# Patient Record
Sex: Female | Born: 2000 | Race: White | Hispanic: No | Marital: Single | Smoking: Never smoker
Health system: Southern US, Community
[De-identification: ages and names within clinical notes are randomized; demographics above are authoritative.]

## PROBLEM LIST (undated history)

## (undated) DIAGNOSIS — E063 Autoimmune thyroiditis: Secondary | ICD-10-CM

## (undated) DIAGNOSIS — E049 Nontoxic goiter, unspecified: Secondary | ICD-10-CM

## (undated) DIAGNOSIS — K900 Celiac disease: Secondary | ICD-10-CM

## (undated) DIAGNOSIS — F909 Attention-deficit hyperactivity disorder, unspecified type: Secondary | ICD-10-CM

## (undated) DIAGNOSIS — E10649 Type 1 diabetes mellitus with hypoglycemia without coma: Secondary | ICD-10-CM

## (undated) DIAGNOSIS — H5089 Other specified strabismus: Secondary | ICD-10-CM

## (undated) DIAGNOSIS — E11649 Type 2 diabetes mellitus with hypoglycemia without coma: Secondary | ICD-10-CM

## (undated) DIAGNOSIS — E109 Type 1 diabetes mellitus without complications: Secondary | ICD-10-CM

## (undated) DIAGNOSIS — E7801 Familial hypercholesterolemia: Secondary | ICD-10-CM

## (undated) HISTORY — DX: Nontoxic goiter, unspecified: E04.9

## (undated) HISTORY — DX: Autoimmune thyroiditis: E06.3

## (undated) HISTORY — DX: Familial hypercholesterolemia: E78.01

## (undated) HISTORY — DX: Type 1 diabetes mellitus without complications: E10.9

## (undated) HISTORY — DX: Attention-deficit hyperactivity disorder, unspecified type: F90.9

## (undated) HISTORY — PX: NO PAST SURGERIES: SHX2092

## (undated) HISTORY — DX: Type 1 diabetes mellitus with hypoglycemia without coma: E10.649

## (undated) HISTORY — DX: Celiac disease: K90.0

## (undated) HISTORY — DX: Other specified strabismus: H50.89

## (undated) HISTORY — DX: Type 2 diabetes mellitus with hypoglycemia without coma: E11.649

---
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
## 2019-01-16 ENCOUNTER — Other Ambulatory Visit: Payer: Self-pay

## 2019-01-16 DIAGNOSIS — Z20822 Contact with and (suspected) exposure to covid-19: Secondary | ICD-10-CM

## 2019-01-16 DIAGNOSIS — Z20828 Contact with and (suspected) exposure to other viral communicable diseases: Secondary | ICD-10-CM | POA: Diagnosis not present

## 2019-01-18 LAB — NOVEL CORONAVIRUS, NAA

## 2019-01-20 ENCOUNTER — Encounter: Payer: Self-pay | Admitting: Family

## 2019-01-20 ENCOUNTER — Other Ambulatory Visit: Payer: Self-pay

## 2019-01-20 DIAGNOSIS — Z20822 Contact with and (suspected) exposure to covid-19: Secondary | ICD-10-CM

## 2019-01-20 DIAGNOSIS — Z20828 Contact with and (suspected) exposure to other viral communicable diseases: Secondary | ICD-10-CM | POA: Diagnosis not present

## 2019-01-21 ENCOUNTER — Other Ambulatory Visit: Payer: Self-pay

## 2019-01-21 DIAGNOSIS — F411 Generalized anxiety disorder: Secondary | ICD-10-CM

## 2019-01-21 MED ORDER — SERTRALINE HCL 100 MG PO TABS: 100.0000 mg | ORAL_TABLET | Freq: Every day | ORAL | 0 refills | Status: DC

## 2019-01-21 MED FILL — SERTRALINE HCL 100 MG TAB: 100 | 90 days supply | Qty: 90 | Fill #0

## 2019-01-21 NOTE — Telephone Encounter (Signed)
Patient emailed in for refill for Zoloft. Last visit 09/11/2018 next visit 02/19/2019. Please escribe to [employer] [facility]

## 2019-01-21 NOTE — Telephone Encounter (Signed)
RX for above e-scribed and sent to pharmacy on record  [facility] - [place], [place] - [address] [place] [address] Phone: [phone] Fax: [phone]    

## 2019-01-22 ENCOUNTER — Encounter (INDEPENDENT_AMBULATORY_CARE_PROVIDER_SITE_OTHER): Payer: Self-pay | Admitting: Pediatrics

## 2019-01-22 ENCOUNTER — Ambulatory Visit (INDEPENDENT_AMBULATORY_CARE_PROVIDER_SITE_OTHER): Payer: BC Managed Care – PPO | Admitting: Pediatrics

## 2019-01-22 ENCOUNTER — Telehealth (INDEPENDENT_AMBULATORY_CARE_PROVIDER_SITE_OTHER): Payer: Self-pay | Admitting: *Deleted

## 2019-01-22 ENCOUNTER — Other Ambulatory Visit: Payer: Self-pay

## 2019-01-22 DIAGNOSIS — K900 Celiac disease: Secondary | ICD-10-CM

## 2019-01-22 DIAGNOSIS — E063 Autoimmune thyroiditis: Secondary | ICD-10-CM | POA: Diagnosis not present

## 2019-01-22 DIAGNOSIS — E1069 Type 1 diabetes mellitus with other specified complication: Secondary | ICD-10-CM

## 2019-01-22 DIAGNOSIS — E109 Type 1 diabetes mellitus without complications: Secondary | ICD-10-CM | POA: Diagnosis not present

## 2019-01-22 DIAGNOSIS — E785 Hyperlipidemia, unspecified: Secondary | ICD-10-CM

## 2019-01-22 DIAGNOSIS — N921 Excessive and frequent menstruation with irregular cycle: Secondary | ICD-10-CM

## 2019-01-22 DIAGNOSIS — Z9641 Presence of insulin pump (external) (internal): Secondary | ICD-10-CM

## 2019-01-22 LAB — NOVEL CORONAVIRUS, NAA: SARS-CoV-2, NAA: DETECTED — AB

## 2019-01-22 NOTE — Patient Instructions (Signed)

## 2019-01-22 NOTE — Telephone Encounter (Signed)
TC to [place] to advise that I called [facility] to increase pump supplies to 15/mo. Rep specified that order is ready for patient and or parent to call them to send order in. Patient ok with information given.

## 2019-01-22 NOTE — Progress Notes (Signed)
This is a Pediatric Specialist E-Visit follow up consult provided via WebEx Kiara Greer Greer consented to an E-Visit consult today.  Location of patient: Kiara Greer is at hotel in [place] Location of provider: Theodosia Palingshley Bashioum Brittnay Pigman,MD is at [facility] ([facility]) office Patient was referred by Santa GeneraBates, Melisa, MD   The following participants were involved in this E-Visit: Casimiro NeedleAshley Bashioum Kamilia Carollo, MD, Kiara Greer (patient)  Chief Complain/ Reason for E-Visit today: Follow-up T1DM Total time on call: 18 minutes Follow up: 3 months   Pediatric Endocrinology Diabetes Consultation Follow-up Visit  Kiara Greer Greer 09/28/2000 [id number]  Chief Complaint: Follow-up type 1 diabetes, acquired hypothyroidism, celiac disease, abnormal lipids, menorrhagia  Santa GeneraBates, Melisa, MD   HPI: Kiara Greer Greer is a 18 y.o. female presenting for follow-up of the above concerns.  she attended this visit alone.   THIS IS A TELEHEALTH VIDEO VISIT.  1. Kiara Greer was diagnosed with type 1 diabetes mellitus at age 19 months in 2002; she was not in DKA at diagnosis.  [facility]. She was started on an insulin pump one week after diagnosis.  She most recently upgraded to a medtronic 530G pump in Summer 2016.  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 Celiac disease was diagnosed in 2008. She was referred to our clinic on 01/14/09 at age [age] years.  Her mother and younger sister, [name] also had T1DM and celiac disease. Mother was also hypothyroid then. Sister [name] was initially euthyroid, but developed hypothyroidism in 2012.She started using a dexcom CGM in 04/2016. She transitioned to a Tslim pump in 11/2018.  2. Since last visit on 10/10/2018, she has been well.   ED visits/Hospitalizations: No   Concerns:  -Currently in quarantine hotel through college for COVID exposure.  Tested positive this morning.   Symptoms are improving (initially had headache, runny nose, sore throat, mild cough).   -Likes tslim pump, doing better with bolusing for carbs  Insulin regimen: Unable to download her pump as Tslim website is down Tslim settings (from pump start 11/06/2018) Time Basal Rate Correction factor Carb ratio Target BG   12a 1.0 35 mg/dL 9 409120 mg/dL  6a 1.1 30 mg/dL 9 811110 mg/dL  8a 9.141.35 30 mg/dL 9  782110 mg/dL  95A12p 2.131.35 30 mg/dL 9 086110 mg/dL  57Q10p 4.691.35 35 mg/dL 9 629120 mg/dL   Total 52.829.8 Units       BG/Pump download:  Unable to perform due to video visit/website being down  CGM download: Unable to perform  Hypoglycemia: No recent low blood sugars.  No glucagon needed recently.  Wearing Med-alert ID currently: Not discussed Injection sites: CGM on arms, pump site on buttocks Annual labs due: 10/2019 Ophthalmology due: 09/2019 (Performed 09/2018 with Dr. Randon GoldsmithLyles, no retinopathy)  Acquired hypothyroidism: She takes synthroid 137mcg daily (increased 10/2017).   Energy level: good, has been more tired since school started back (thinks it may just be related to more activity since COVID pandemic started) Sleep: as usual (not a great sleeper) Constipation/Diarrhea: no Periods regular: yes, on OCPs below Tremor: no Palpitations: no  Celiac disease: Continues on a gluten free diet "for the most part" Nausea/vomiting/constipation/diarrhea: no Weight changes: no  Hyperlipidemia: She continues on crestor 10mg  daily (increased in 10/2018) and a plant based sterol called cholest-off.  Muscle aches: None  Menorrhagia with irregular cycles:  Started on OCPs in 12/2016 for menorrhagia with irregular cycles with improvement in menses though these were stopped in 03/2017 due  to concerns they were contributing to depression.  Menses resumed very heavy so she was restarted on OCPs in 07/2017 without change in depression.   Continues on Junel Fe 1/20 Withdrawal bleeding during week of inactive pills:  yes Spotting: no Severe cramping: not as bad as in the past.  Every 2 months period is heavier Advised against smoking while on OCPs    ROS: All systems reviewed with pertinent positives listed below; otherwise negative. Constitutional: Sleeping as above HEENT: eye exam as above Respiratory: No increased work of breathing currently GI: No constipation or diarrhea GU: periods as above Musculoskeletal: No joint deformity Neuro: Normal affect Endocrine: As above  Past Medical History:   Past Medical History:  Diagnosis Date  . ADHD (attention deficit hyperactivity disorder)   . Celiac disease   . Diabetes mellitus type I ([place])   . Duanes syndrome   . Goiter   . Hashimoto's thyroiditis   . Hyperlipidemia type II   . Hypoglycemia associated with diabetes ([place])   . Hypoglycemia unawareness in type 1 diabetes mellitus ([place])   . Hypothyroidism, acquired, autoimmune     Medications:  Outpatient Encounter Medications as of 01/22/2019  Medication Sig Note  . acetone, urine, test strip Check ketones per protocol   . albuterol (VENTOLIN HFA) 108 (90 Base) MCG/ACT inhaler 1-2 inhalations every 4-6 hours as needed for wheezing. Dispense spacer as needed. 10/10/2018: Keeps for PRN use  . Amphetamine Sulfate (EVEKEO) 10 MG TABS Take 10-20 mg by mouth 2 (two) times daily.   . Calcium-Vitamin D-Vitamin K (VIACTIV) 751-025-85 MG-UNT-MCG CHEW Chew by mouth.     . cetirizine (ZYRTEC) 10 MG tablet Take 10 mg by mouth as needed for allergies.   . Continuous Blood Gluc Sensor (DEXCOM G6 SENSOR) MISC Inject 1 application into the skin as needed (Apply new sensor every 10 days).   . Continuous Blood Gluc Transmit (DEXCOM G6 TRANSMITTER) MISC Inject 1 Device into the skin as needed (Use new transmitter every 3 months).   . fluticasone (FLONASE) 50 MCG/ACT nasal spray 2 sprays in each nostril daily x1 week then 1-2 sprays in each nostril daily. (use smallest dose possible for symptom control after week 1).    . Glucagon (BAQSIMI TWO PACK) 3 MG/DOSE POWD Place 1 application into the nose as needed. Use as directed if unconscious, unable to take food po, or having a seizure due to hypoglycemia   . insulin aspart (NOVOLOG) 100 UNIT/ML injection 300 units in  Insulin Pump every 48 hours   . Multiple Vitamin (MULTIVITAMIN) tablet Take 1 tablet by mouth daily.     . norethindrone-ethinyl estradiol (JUNEL FE 1/20) 1-20 MG-MCG tablet Take 1 tablet by mouth daily.   Marland Kitchen PROAIR HFA 108 (90 Base) MCG/ACT inhaler 1-2 INHALATIONS EVERY 4-6 HOURS AS NEEDED FOR WHEEZING. DISPENSE SPACER AS NEEDED. 10/10/2018: Keeps for PRN use  . rosuvastatin (CRESTOR) 10 MG tablet Take 1 tablet (10 mg total) by mouth daily.   . sertraline (ZOLOFT) 100 MG tablet Take 1 tablet (100 mg total) by mouth daily.   Marland Kitchen SYNTHROID 137 MCG tablet Take 1 tablet (137 mcg total) by mouth daily. Brand name medically necessary    No facility-administered encounter medications on file as of 01/22/2019.   Taking an OTC tablet called cholest-off for hyperlipidemia (contains plant sterols)  Allergies: No Known Allergies  Surgical History: Past Surgical History:  Procedure Laterality Date  . NO PAST SURGERIES      Family History:  Family History  Problem Relation Age of Onset  . Diabetes Mother   . Thyroid disease Mother   . Celiac disease Mother   . Diabetes Sister   . Thyroid disease Sister   . Celiac disease Sister   . Diabetes Maternal Uncle   . Thyroid disease Maternal Grandmother   . Celiac disease Maternal Grandmother   . Cancer Neg Hx     Social History: Attending [employer], sophomore year  Physical Exam:  There were no vitals filed for this visit. There were no vitals taken for this visit. Body mass index: body mass index is unknown because there is no height or weight on file. Blood pressure percentiles are not available for patients who are 18 years or older.  Ht Readings from Last 3 Encounters:  01/31/18 5' 4.57" (1.64 m)  (55 %, Z= 0.12)*  10/25/17 5' 5.35" (1.66 m) (67 %, Z= 0.43)*  07/05/17 5' 4.76" (1.645 m) (58 %, Z= 0.21)*   * Growth percentiles are based on CDC (Girls, 2-20 Years) data.   Wt Readings from Last 3 Encounters:  01/31/18 136 lb (61.7 kg) (68 %, Z= 0.46)*  10/25/17 128 lb 9.6 oz (58.3 kg) (57 %, Z= 0.18)*  07/05/17 130 lb 12.8 oz (59.3 kg) (62 %, Z= 0.31)*   * Growth percentiles are based on CDC (Girls, 2-20 Years) data.   General: Well developed, well nourished female in no acute distress.  Appears stated age.  Well appearing Head: Normocephalic, atraumatic.   Eyes:  Sclera white.  No eye drainage.   Ears/Nose/Mouth/Throat: Nares patent, no nasal drainage.   Neck: No obvious thyromegaly Cardiovascular: Well perfused, no cyanosis Respiratory: No increased work of breathing.  No cough. Extremities: Moving extremities well.   Skin: No rash or lesions. Neurologic: alert and oriented, normal speech  Labs: Hemoglobin A1c trend: 9.3%-->10.7%-->10.2% -->9.3%-->8.6%-->8.3% 12/2016-->11.2% 04/2017-->9.5% 07/2017-->8.9% 10/2017-->11% 01/2018, 11.4% 10/2018  Lab Results  Component Value Date   POCGLU 117 (A) 01/31/2018   Lab Results  Component Value Date   HGBA1C 11.4 (H) 10/14/2018   Assessment/Plan: Ellizabeth Dacruz is a 18 y.o. female with T1DM in suboptimal (though hopefully improving) control on the Tandem control IQ closed loop pump/CGM combo.  She also has acquired hypothyroidism and is clinically euthyroid on current levothyroxine dosing.  She continues to follow a gluten-free diet most of the time and has no GI symptoms.  She also continues on rosuvastatin and cholest-off for hyperlipidemia.  She is doing well on low dose OCP.  When a patient is on insulin, intensive monitoring of blood glucose levels and continuous insulin titration is vital to avoid insulin toxicity leading to severe hypoglycemia. Severe hypoglycemia can lead to seizure or death. Hyperglycemia can also result from  inadequate insulin dosing and can lead to ketosis requiring ICU admission and intravenous insulin.   1. Uncontrolled diabetes mellitus type 1 without complications (HCC) 2. Insulin pump in place -Continue current pump settings.  Advised to call if having lows/highs or patterns that need pump adjustment.  -Needs more supplies per month.  Will have Lorena call [facility] to relay this info and allow more supplies with each shipment.   3. Acquired autoimmune hypothyroidism -Continue current levothyroxine  4. Celiac disease -Continue gluten free diet  5. Hyperlipidemia due to type 1 diabetes mellitus (HCC) -Continue rosuvastatin  6. Menorrhagia with irregular cycle -Continue current OCP  Follow-up:   Return in about 3 months (around 04/24/2019).    Casimiro Needle, MD

## 2019-01-30 DIAGNOSIS — E1065 Type 1 diabetes mellitus with hyperglycemia: Secondary | ICD-10-CM | POA: Diagnosis not present

## 2019-02-19 ENCOUNTER — Encounter: Payer: Self-pay | Admitting: Family

## 2019-02-19 ENCOUNTER — Ambulatory Visit (INDEPENDENT_AMBULATORY_CARE_PROVIDER_SITE_OTHER): Payer: BC Managed Care – PPO | Admitting: Family

## 2019-02-19 ENCOUNTER — Other Ambulatory Visit: Payer: Self-pay

## 2019-02-19 DIAGNOSIS — K900 Celiac disease: Secondary | ICD-10-CM

## 2019-02-19 DIAGNOSIS — F819 Developmental disorder of scholastic skills, unspecified: Secondary | ICD-10-CM

## 2019-02-19 DIAGNOSIS — E063 Autoimmune thyroiditis: Secondary | ICD-10-CM

## 2019-02-19 DIAGNOSIS — H5089 Other specified strabismus: Secondary | ICD-10-CM

## 2019-02-19 DIAGNOSIS — F411 Generalized anxiety disorder: Secondary | ICD-10-CM

## 2019-02-19 DIAGNOSIS — F900 Attention-deficit hyperactivity disorder, predominantly inattentive type: Secondary | ICD-10-CM

## 2019-02-19 DIAGNOSIS — E7801 Familial hypercholesterolemia: Secondary | ICD-10-CM

## 2019-02-19 DIAGNOSIS — E049 Nontoxic goiter, unspecified: Secondary | ICD-10-CM

## 2019-02-19 DIAGNOSIS — Z719 Counseling, unspecified: Secondary | ICD-10-CM

## 2019-02-19 DIAGNOSIS — Z79899 Other long term (current) drug therapy: Secondary | ICD-10-CM

## 2019-02-19 DIAGNOSIS — E7800 Pure hypercholesterolemia, unspecified: Secondary | ICD-10-CM

## 2019-02-19 DIAGNOSIS — E109 Type 1 diabetes mellitus without complications: Secondary | ICD-10-CM

## 2019-02-19 MED ORDER — AMPHETAMINE SULFATE 10 MG PO TABS: 10.0000 mg | ORAL_TABLET | Freq: Two times a day (BID) | ORAL | 0 refills | Status: DC

## 2019-02-19 MED FILL — AMPHETAMINE SULFATE 10 MG T: 10 | 30 days supply | Qty: 120 | Fill #0

## 2019-02-19 NOTE — Progress Notes (Signed)
[facility] [address] [place] [address] Dept: [phone] Dept Fax: [phone]  Medication Check visit via Virtual Video due to COVID-19  Patient ID:  Kiara Greer  female DOB: 2000/08/23   18 y.o.   MRN: [id number]   DATE:02/19/19  PCP: Kandace Blitz, MD  Virtual Visit via Video Note  I connected with  Kiara Greer on 02/19/19 at 10:00 AM EST by a video enabled telemedicine application and verified that I am speaking with the correct person using two identifiers. Patient/Parent Location: at school   I discussed the limitations, risks, security and privacy concerns of performing an evaluation and management service by telephone and the availability of in person appointments. I also discussed with the parents that there may be a patient responsible charge related to this service. The parents expressed understanding and agreed to proceed.  Provider: Carolann Littler, NP  Location: work location  HISTORY/CURRENT STATUS: Kiara Greer is here for medication management of the psychoactive medications for ADHD and review of educational and behavioral concerns.   Karsten currently taking Evekeo and Zoloft, which is working well. Takes medication in the morning with breakfast. Medication tends to wear off around evening for the Evekeo. [place]ia is able to focus through school/homework.   Rudie is eating well (eating breakfast, lunch and dinner). Eating with no issues.   Sleeping well (getting plenty of sleep each night), sleeping through the night.   EDUCATION: School: [employer]  Year/Grade: college  Performance/ Grades: above average Services: Other: help as needed  Kiara Greer is currently in distance learning due to social distancing due to COVID-19 and will continue for at least: for the fall semester.  Activities/ Exercise: intermittently, walking on campus and on occasion by  herself for exercise.   Screen time: (phone, tablet, TV, computer): computer for school, phone, TV, and movies.   MEDICAL HISTORY: Individual Medical History/ Review of Systems: Changes? :Yes, recently had COVID-19 with positive results. Had endocrinology telehealth visit recently.   Family Medical/ Social History: Changes? None recently.  Patient Lives with: 3 roommates  Current Medications:  Current Outpatient Medications  Medication Instructions  . acetone, urine, test strip Check ketones per protocol  . albuterol (VENTOLIN HFA) 108 (90 Base) MCG/ACT inhaler 1-2 inhalations every 4-6 hours as needed for wheezing. Dispense spacer as needed.  . Amphetamine Sulfate (EVEKEO) 10-20 mg, Oral, 2 times daily  . Calcium-Vitamin D-Vitamin K (VIACTIV) 606-301-60 MG-UNT-MCG CHEW Chew by mouth.    . cetirizine (ZYRTEC) 10 mg, Oral, As needed  . Continuous Blood Gluc Sensor (DEXCOM G6 SENSOR) MISC 1 application, Subcutaneous, As needed  . Continuous Blood Gluc Transmit (DEXCOM G6 TRANSMITTER) MISC 1 Device, Subcutaneous, As needed  . fluticasone (FLONASE) 50 MCG/ACT nasal spray 2 sprays in each nostril daily x1 week then 1-2 sprays in each nostril daily. (use smallest dose possible for symptom control after week 1).  . Glucagon (BAQSIMI TWO PACK) 3 MG/DOSE POWD 1 application, Nasal, As needed, Use as directed if unconscious, unable to take food po, or having a seizure due to hypoglycemia  . insulin aspart (NOVOLOG) 100 UNIT/ML injection 300 units in  Insulin Pump every 48 hours  . Multiple Vitamin (MULTIVITAMIN) tablet 1 tablet, Daily  . norethindrone-ethinyl estradiol (JUNEL FE 1/20) 1-20 MG-MCG tablet 1 tablet, Oral, Daily  . PROAIR HFA 108 (90 Base) MCG/ACT inhaler 1-2 INHALATIONS EVERY 4-6 HOURS AS NEEDED FOR WHEEZING. DISPENSE SPACER AS NEEDED.  Marland Kitchen rosuvastatin (  CRESTOR) 10 mg, Oral, Daily  . sertraline (ZOLOFT) 100 mg, Oral, Daily  . Synthroid 137 mcg, Oral, Daily, Brand name medically  necessary   Medication Side Effects: None  MENTAL HEALTH: Mental Health Issues:   Anxiety-Zoloft assisting with symptom control.   DIAGNOSES:    ICD-10-CM   1. ADHD (attention deficit hyperactivity disorder), inattentive type  F90.0 Amphetamine Sulfate (EVEKEO) 10 MG TABS  2. Learning difficulty  F81.9   3. Generalized anxiety disorder  F41.1   4. Hashimoto's thyroiditis  E06.3   5. Celiac disease  K90.0   6. Hypercholesterolemia  E78.00   7. Hyperlipidemia type II  E78.01   8. Type 1 diabetes mellitus without complication (HCC)  E10.9   9. 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 Acquired autoimmune hypothyroidism  E06.3   11. Goiter  E04.9   12. Medication management  Z79.899   13. Patient counseled  Z71.9     RECOMMENDATIONS:  Discussed recent history with patient with updates for health, learning, and medication management.   Discussed school academic progress and recommended continued accommodations for the school year.  Discussed continued need for structure, routine, reward (external), motivation (internal), positive reinforcement, consequences, and organization with school and online learning.   Encouraged recommended limitations on TV, tablets, phones, video games and computers for non-educational activities.   Discussed need for bedtime routine, use of good sleep hygiene, no video games, TV or phones for an hour before bedtime.   Encouraged physical activity and outdoor play, maintaining social distancing.   Counseled medication pharmacokinetics, options, dosage, administration, desired effects, and possible side effects.   Zoloft 100 mg daily, no Rx today.  Evekeo 10 mg 1-2 tablets twice daily, # 120 with no RF"s. RX for above e-scribed and sent to pharmacy on record  [facility] - [place], [place] - [address] [place] [place] [address] Phone: [phone] Fax: [phone]  I discussed the assessment  and treatment plan with the patient. The patient was provided an opportunity to ask questions and all were answered. The patient agreed with the plan and demonstrated an understanding of the instructions.   I provided 25 minutes of non-face-to-face time during this encounter.   Completed record review for 10 minutes prior to the virtual video visit.   NEXT APPOINTMENT:  Return in about 3 months (around 05/22/2019) for follow up visit.  The patient was advised to call back or seek an in-person evaluation if the symptoms worsen or if the condition fails to improve as anticipated.  Medical Decision-making: More than 50% of the appointment was spent counseling and discussing diagnosis and management of symptoms with the patient and family.  Carron Curie, NP

## 2019-02-28 ENCOUNTER — Other Ambulatory Visit (INDEPENDENT_AMBULATORY_CARE_PROVIDER_SITE_OTHER): Payer: Self-pay | Admitting: Pediatrics

## 2019-05-06 ENCOUNTER — Encounter: Payer: Self-pay | Admitting: Family

## 2019-05-06 ENCOUNTER — Other Ambulatory Visit: Payer: Self-pay

## 2019-05-06 ENCOUNTER — Ambulatory Visit (INDEPENDENT_AMBULATORY_CARE_PROVIDER_SITE_OTHER): Payer: BC Managed Care – PPO | Admitting: Family

## 2019-05-06 DIAGNOSIS — F900 Attention-deficit hyperactivity disorder, predominantly inattentive type: Secondary | ICD-10-CM | POA: Diagnosis not present

## 2019-05-06 DIAGNOSIS — K900 Celiac disease: Secondary | ICD-10-CM

## 2019-05-06 DIAGNOSIS — F411 Generalized anxiety disorder: Secondary | ICD-10-CM

## 2019-05-06 DIAGNOSIS — E7800 Pure hypercholesterolemia, unspecified: Secondary | ICD-10-CM | POA: Diagnosis not present

## 2019-05-06 DIAGNOSIS — E785 Hyperlipidemia, unspecified: Secondary | ICD-10-CM

## 2019-05-06 DIAGNOSIS — R278 Other lack of coordination: Secondary | ICD-10-CM

## 2019-05-06 DIAGNOSIS — E1069 Type 1 diabetes mellitus with other specified complication: Secondary | ICD-10-CM

## 2019-05-06 DIAGNOSIS — Z79899 Other long term (current) drug therapy: Secondary | ICD-10-CM

## 2019-05-06 DIAGNOSIS — E049 Nontoxic goiter, unspecified: Secondary | ICD-10-CM

## 2019-05-06 DIAGNOSIS — E7801 Familial hypercholesterolemia: Secondary | ICD-10-CM

## 2019-05-06 DIAGNOSIS — F819 Developmental disorder of scholastic skills, unspecified: Secondary | ICD-10-CM

## 2019-05-06 DIAGNOSIS — E063 Autoimmune thyroiditis: Secondary | ICD-10-CM

## 2019-05-06 DIAGNOSIS — Z719 Counseling, unspecified: Secondary | ICD-10-CM

## 2019-05-06 MED ORDER — AMPHETAMINE SULFATE 10 MG PO TABS: 10.0000 mg | ORAL_TABLET | Freq: Two times a day (BID) | ORAL | 0 refills | Status: DC

## 2019-05-06 MED ORDER — SERTRALINE HCL 100 MG PO TABS: 100.0000 mg | ORAL_TABLET | Freq: Every day | ORAL | 0 refills | Status: DC

## 2019-05-06 MED FILL — SERTRALINE HCL 100 MG TAB: 100 | 90 days supply | Qty: 90 | Fill #0

## 2019-05-06 MED FILL — AMPHETAMINE SULFATE 10 MG T: 10 | 30 days supply | Qty: 120 | Fill #0

## 2019-05-06 NOTE — Progress Notes (Signed)
[facility] [address] [place]. [address] [place] [address] Dept: [phone] Dept Fax: [phone]  Medication Check visit via Virtual Video due to COVID-19  Patient ID:  Kiara Greer  female DOB: [birth year]   19 y.o.   MRN: [id number]   DATE:05/06/19  PCP: Santa Genera, MD  Virtual Visit via Video Note  I connected with  [name] on 05/06/19 at  2:00 PM EST by a video enabled telemedicine application and verified that I am speaking with the correct person using two identifiers. Patient/Parent Location: at school   I discussed the limitations, risks, security and privacy concerns of performing an evaluation and management service by telephone and the availability of in person appointments. I also discussed with the parents that there may be a patient responsible charge related to this service. The parents expressed understanding and agreed to proceed.  Provider: Carron Curie, NP  Location: private location  HISTORY/CURRENT STATUS: Kiara Greer is here for medication management of the psychoactive medications for ADHD and review of educational and behavioral concerns.   [name] currently taking medication regimen, which is working well. Takes medication as directed. Medication tends to wear off around evening time with Evekeo. Zakyria is able to focus through school/homework.   [name] is eating well (eating breakfast, lunch and dinner). Eating well with no issues.   Sleeping well (getting better now with change back from home to school), sleeping through the night.   EDUCATION: School: [employer]  Year/Grade: college  Performance/ Grades: above average Services: IEP/504 Plan and Other: Disability  [name] is currently in distance learning due to social distancing due to COVID-19 and will continue through: for the remainder of the semester.    Activities/ Exercise: intermittently Screen  time: (phone, tablet, TV, computer): computer for learning, TV, phone, and movies.   MEDICAL HISTORY: Individual Medical History/ Review of Systems: Changes? :None  Family Medical/ Social History: Changes? None recently Patient Lives with: roommate at school  Current Medications:  Current Outpatient Medications  Medication Instructions  . acetone, urine, test strip Check ketones per protocol  . albuterol (VENTOLIN HFA) 108 (90 Base) MCG/ACT inhaler 1-2 inhalations every 4-6 hours as needed for wheezing. Dispense spacer as needed.  . Amphetamine Sulfate (EVEKEO) 10-20 mg, Oral, 2 times daily  . Calcium-Vitamin D-Vitamin K (VIACTIV) 500-100-40 MG-UNT-MCG CHEW Chew by mouth.    . cetirizine (ZYRTEC) 10 mg, Oral, As needed  . Continuous Blood Gluc Sensor (DEXCOM G6 SENSOR) MISC 1 application, Subcutaneous, As needed  . Continuous Blood Gluc Transmit (DEXCOM G6 TRANSMITTER) MISC CHANGE EVERY 3 MONTHS  . fluticasone (FLONASE) 50 MCG/ACT nasal spray 2 sprays in each nostril daily x1 week then 1-2 sprays in each nostril daily. (use smallest dose possible for symptom control after week 1).  . Glucagon (BAQSIMI TWO PACK) 3 MG/DOSE POWD 1 application, Nasal, As needed, Use as directed if unconscious, unable to take food po, or having a seizure due to hypoglycemia  . insulin aspart (NOVOLOG) 100 UNIT/ML injection 300 units in  Insulin Pump every 48 hours  . Multiple Vitamin (MULTIVITAMIN) tablet 1 tablet, Daily  . norethindrone-ethinyl estradiol (JUNEL FE 1/20) 1-20 MG-MCG tablet 1 tablet, Oral, Daily  . PROAIR HFA 108 (90 Base) MCG/ACT inhaler 1-2 INHALATIONS EVERY 4-6 HOURS AS NEEDED FOR WHEEZING. DISPENSE SPACER AS NEEDED.  Marland Kitchen rosuvastatin (CRESTOR) 10 mg, Oral, Daily  . sertraline (ZOLOFT) 100 mg, Oral, Daily  . Synthroid 137 mcg, Oral, Daily,  Brand name medically necessary   Medication Side Effects: None  MENTAL HEALTH: Mental Health Issues:   Anxiety-some increase  DIAGNOSES:    ICD-10-CM    1. ADHD (attention deficit hyperactivity disorder), inattentive type  F90.0 Amphetamine Sulfate (EVEKEO) 10 MG TABS  2. Generalized anxiety disorder  F41.1 sertraline (ZOLOFT) 100 MG tablet  3. Hashimoto's thyroiditis  E06.3   4. 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 Hypercholesterolemia  E78.00   6. Hyperlipidemia type II  E78.01   7. Celiac disease  K90.0   8. Goiter  E04.9   9. Problems with learning  F81.9   10. Dysgraphia  R27.8   11. Medication management  Z79.899   12. Patient counseled  Z71.9   13. 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   Discussed school academic progress and recommended continued accommodations as needed for academics success.   Discussed growth and development and current weight with patient. Recommended healthy food choices, watching portion sizes, avoiding second helpings, avoiding sugary drinks like soda and tea, drinking more water, getting more exercise.   Discussed continued need for structure, routine, reward (external), motivation (internal), positive reinforcement, consequences, and organization with school and living in the dorm room with roommate.   Encouraged recommended limitations on TV, tablets, phones, video games and computers for non-educational activities.   Discussed need for bedtime routine, use of good sleep hygiene, no video games, TV or phones for an hour before bedtime.   Encouraged physical activity and outdoor play, maintaining social distancing.   Counseled medication pharmacokinetics, options, dosage, administration, desired effects, and possible side effects.   Evekeo 10 mg 1-2 tablet BID # 120 with no RF's Zoloft 100 mg daily, # 90 with no RF's. RX for above e-scribed and sent to pharmacy on record  [place], [place] -  [address] [place] [address] Phone: [phone] Fax: [phone]   I discussed the assessment and treatment plan with the patient. The patient was provided an opportunity to ask questions and all were answered. The patient agreed with the plan and demonstrated an understanding of the instructions.   I provided 25 minutes of non-face-to-face time during this encounter.  Completed record review for 10 minutes prior to the virtual video visit.   NEXT APPOINTMENT:  Return in about 3 months (around 08/03/2019) for follow up visit.  The patient was advised to call back or seek an in-person evaluation if the symptoms worsen or if the condition fails to improve as anticipated.  Medical Decision-making: More than 50% of the appointment was spent counseling and discussing diagnosis and management of symptoms with the patient and family.  Carolann Littler, NP

## 2019-06-25 DIAGNOSIS — E1065 Type 1 diabetes mellitus with hyperglycemia: Secondary | ICD-10-CM | POA: Diagnosis not present

## 2019-07-10 ENCOUNTER — Encounter (INDEPENDENT_AMBULATORY_CARE_PROVIDER_SITE_OTHER): Payer: Self-pay | Admitting: Pediatrics

## 2019-07-10 ENCOUNTER — Telehealth (INDEPENDENT_AMBULATORY_CARE_PROVIDER_SITE_OTHER): Payer: BC Managed Care – PPO | Admitting: Pediatrics

## 2019-07-10 VITALS — Ht 65.0 in

## 2019-07-10 DIAGNOSIS — N921 Excessive and frequent menstruation with irregular cycle: Secondary | ICD-10-CM

## 2019-07-10 DIAGNOSIS — Z9641 Presence of insulin pump (external) (internal): Secondary | ICD-10-CM

## 2019-07-10 DIAGNOSIS — E063 Autoimmune thyroiditis: Secondary | ICD-10-CM | POA: Diagnosis not present

## 2019-07-10 DIAGNOSIS — E109 Type 1 diabetes mellitus without complications: Secondary | ICD-10-CM | POA: Diagnosis not present

## 2019-07-10 DIAGNOSIS — E1069 Type 1 diabetes mellitus with other specified complication: Secondary | ICD-10-CM

## 2019-07-10 DIAGNOSIS — K900 Celiac disease: Secondary | ICD-10-CM | POA: Diagnosis not present

## 2019-07-10 DIAGNOSIS — E785 Hyperlipidemia, unspecified: Secondary | ICD-10-CM

## 2019-07-10 NOTE — Patient Instructions (Addendum)

## 2019-07-10 NOTE — Progress Notes (Signed)
This is a Pediatric Specialist E-Visit follow up consult provided via [place] consented to an E-Visit consult today.  Location of patient: Kiara Greer is at home Location of provider: Glenna Greer is at [facility] (Pediatric Endocrinology) office Patient was referred by Kiara Blitz, MD   The following participants were involved in this E-Visit: Kiara Hedger, MD, patient  Chief Complain/ Reason for E-Visit today: see below Total time on call: 15 minutes Follow up: 3 months  Pediatric Endocrinology Diabetes Consultation Follow-up Visit  Kiara Greer [birth year] [id number]  Chief Complaint: Follow-up type 1 diabetes, acquired hypothyroidism, celiac disease, abnormal lipids, menorrhagia  Kiara Blitz, MD   HPI: Kiara Greer is a 19 y.o. female presenting for follow-up of the above concerns.  she attended this visit alone.  THIS IS A TELEHEALTH VIDEO VISIT.  57. Kiara Greer was diagnosed with type 1 diabetes mellitus at age 72 months in 2002; she was not in DKA at diagnosis.  She was initially followed at [facility] pediatric endocrinology. She was started on an insulin pump one week after diagnosis.  She most recently upgraded to a medtronic 530G pump in Summer 2016.  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 Celiac disease was diagnosed in 2008. She was referred to our clinic on 01/14/09 at age 28.4 years.  Her mother and younger sister, Kiara Greer also had T1DM and celiac disease. Mother was also hypothyroid then. Sister Kiara Greer was initially euthyroid, but developed hypothyroidism in 2012.She started using a dexcom CGM in 04/2016. She transitioned to a Tslim pump in 11/2018.  2. Since last visit on 01/22/2019 (telehealth), she has been well.   ED visits/Hospitalizations: No   Concerns:  -Overall BGs are doing better.  Dexcom sometimes frustrating because it falls off.  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  No patterns for highs or lows.  Hypoglycemia: Able to feel low blood sugars.  No glucagon needed recently.  Wearing Med-alert ID currently: Not discussed today Injection sites: CGM on arms, pump site on back, sometimes abd Annual labs due: 10/2019 Ophthalmology due: 09/2019 (Performed 09/2018 with Dr. Prudencio Greer, no retinopathy)  Acquired hypothyroidism: She takes synthroid 141mg daily (increased 10/2017).  Better with taking her medicine daily Energy level: has felt tired recently, thinks it is due to increased work with end of the semester coming Sleep: same as usual Constipation/Diarrhea: none Periods regular: yes, on OCPs below  Celiac disease: Continues on a gluten free diet most of the time (doing better than in the past) Nausea/vomiting/constipation/diarrhea: None Weight changes: None that she is aware of  Hyperlipidemia: She continues on crestor 165mdaily (increased in 10/2018) and a plant based sterol called cholest-off.  Muscle aches: None  Menorrhagia with irregular cycles:  Started on OCPs in 12/2016 for menorrhagia with irregular  cycles with improvement in menses though these were stopped in 03/2017 due to concerns they were contributing to depression.  Menses resumed very heavy so she was restarted on OCPs in 07/2017 without change in depression.   Continues on Junel Fe 1/20 Withdrawal bleeding during week of  inactive pills: yes Spotting: no Severe cramping: not too bad   ROS: All systems reviewed with pertinent positives listed below; otherwise negative. Constitutional: Weight as above.  Sleeping as above Respiratory: No increased work of breathing currently GI: As above GU: periods as above Neuro: Normal affect Endocrine: As above  Past Medical History:   Past Medical History:  Diagnosis Date  . ADHD (attention deficit hyperactivity disorder)   . Celiac disease   . Diabetes mellitus type I ([place])   . Duanes syndrome   . Goiter   . Hashimoto's thyroiditis   . Hyperlipidemia type II   . Hypoglycemia associated with diabetes ([place])   . Hypoglycemia unawareness in type 1 diabetes mellitus ([place])   . Hypothyroidism, acquired, autoimmune     Medications:  Outpatient Encounter Medications as of 07/10/2019  Medication Sig Note  . acetone, urine, test strip Check ketones per protocol   . Amphetamine Sulfate (EVEKEO) 10 MG TABS Take 10-20 mg by mouth 2 (two) times daily.   . Calcium-Vitamin D-Vitamin K (VIACTIV) 627-035-00 MG-UNT-MCG CHEW Chew by mouth.     . cetirizine (ZYRTEC) 10 MG tablet Take 10 mg by mouth as needed for allergies.   . Continuous Blood Gluc Sensor (DEXCOM G6 SENSOR) MISC Inject 1 application into the skin as needed (Apply new sensor every 10 days).   . Continuous Blood Gluc Transmit (DEXCOM G6 TRANSMITTER) MISC CHANGE EVERY 3 MONTHS   . Glucagon (BAQSIMI TWO PACK) 3 MG/DOSE POWD Place 1 application into the nose as needed. Use as directed if unconscious, unable to take food po, or having a seizure due to hypoglycemia   . insulin aspart (NOVOLOG) 100 UNIT/ML injection 300 units in  Insulin Pump every 48 hours   . Multiple Vitamin (MULTIVITAMIN) tablet Take 1 tablet by mouth daily.     . norethindrone-ethinyl estradiol (JUNEL FE 1/20) 1-20 MG-MCG tablet Take 1 tablet by mouth daily.   . rosuvastatin (CRESTOR) 10 MG tablet Take 1 tablet (10 mg total) by mouth daily.   .  sertraline (ZOLOFT) 100 MG tablet Take 1 tablet (100 mg total) by mouth daily.   Marland Kitchen SYNTHROID 137 MCG tablet Take 1 tablet (137 mcg total) by mouth daily. Brand name medically necessary   . albuterol (VENTOLIN HFA) 108 (90 Base) MCG/ACT inhaler 1-2 inhalations every 4-6 hours as needed for wheezing. Dispense spacer as needed. 10/10/2018: Keeps for PRN use  . fluticasone (FLONASE) 50 MCG/ACT nasal spray 2 sprays in each nostril daily x1 week then 1-2 sprays in each nostril daily. (use smallest dose possible for symptom control after week 1).   Marland Kitchen PROAIR HFA 108 (90 Base) MCG/ACT inhaler 1-2 INHALATIONS EVERY 4-6 HOURS AS NEEDED FOR WHEEZING. DISPENSE SPACER AS NEEDED. 10/10/2018: Keeps for PRN use   No facility-administered encounter medications on file as of 07/10/2019.  Taking an OTC tablet called cholest-off for hyperlipidemia (contains plant sterols)  Allergies: No Known Allergies  Surgical History: Past Surgical History:  Procedure Laterality Date  . NO PAST SURGERIES      Family History:  Family History  Problem Relation Age of Onset  . Diabetes Mother   . Thyroid disease Mother   . Celiac disease Mother   .  Diabetes Sister   . Thyroid disease Sister   . Celiac disease Sister   . Diabetes Maternal Uncle   . Thyroid disease Maternal Grandmother   . Celiac disease Maternal Grandmother   . Cancer Neg Hx     Social History: Attending [employer], sophomore year.  Internship at [employer] this summer.  Getting apartment over the summer in [place] for school next year  Physical Exam:  Vitals:   07/10/19 0927  Height: 5' 5"  (1.651 m)   Ht 5' 5"  (1.651 m) Comment: Patient stated  LMP 07/03/2019 (Within Days)   BMI 22.63 kg/m  Body mass index: body mass index is 22.63 kg/m. Growth percentile SmartLinks can only be used for patients less than 57 years old.  Ht Readings from Last 3 Encounters:  07/10/19 5' 5"  (1.651 m)  01/31/18 5' 4.57" (1.64 m) (55 %, Z= 0.12)*  10/25/17 5' 5.35" (1.66 m) (67  %, Z= 0.43)*   * Growth percentiles are based on CDC (Girls, 2-20 Years) data.   Wt Readings from Last 3 Encounters:  01/31/18 136 lb (61.7 kg) (68 %, Z= 0.46)*  10/25/17 128 lb 9.6 oz (58.3 kg) (57 %, Z= 0.18)*  07/05/17 130 lb 12.8 oz (59.3 kg) (62 %, Z= 0.31)*   * Growth percentiles are based on CDC (Girls, 2-20 Years) data.   Limited due to video visit.   General: Well developed, well nourished female in no acute distress.  Appears stated age Head: Normocephalic, atraumatic.   Eyes:  Pupils equal and round. Sclera white.  No eye drainage.   Ears/Nose/Mouth/Throat: Nares patent, no nasal drainage.  Normal dentition. Cardiovascular: well perfused, no cyanosis Respiratory: No increased work of breathing.  No cough Neurologic: alert and oriented, normal speech  Labs: Hemoglobin A1c trend: 9.3%-->10.7%-->10.2% -->9.3%-->8.6%-->8.3% 12/2016-->11.2% 04/2017-->9.5% 07/2017-->8.9% 10/2017-->11% 01/2018, 11.4% 10/2018  Lab Results  Component Value Date   POCGLU 117 (A) 01/31/2018   Lab Results  Component Value Date   HGBA1C 11.4 (H) 10/14/2018   Assessment/Plan: Deshia Vanderhoof is a 19 y.o. female with T1DM in suboptimal control on the Tandem control IQ closed loop pump/CGM combo.  She also has acquired hypothyroidism and is clinically euthyroid on current levothyroxine dosing.  She continues to follow a gluten-free diet most of the time and has no GI symptoms.  She also continues on rosuvastatin for hyperlipidemia.  She is doing well on low dose OCP.  When a patient is on insulin, intensive monitoring of blood glucose levels and continuous insulin titration is vital to avoid insulin toxicity leading to severe hypoglycemia. Severe hypoglycemia can lead to seizure or death. Hyperglycemia can also result from inadequate insulin dosing and can lead to ketosis requiring ICU admission and intravenous insulin.   1. Uncontrolled diabetes mellitus type 1 without complications ([place]) 2. Insulin pump  in place -Continue current pump settings.  Advised to call if having lows/highs or patterns that need pump adjustment.  -Will review pump download in detail at next in-person visit -Annual DM labs at next visit  3. Acquired autoimmune hypothyroidism -Continue current levothyroxine.  Will draw labs at next visit.   4. Celiac disease -Continue gluten free diet  5. 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 Menorrhagia with irregular cycle -Continue current OCP  Follow-up:   Return in about 3 months (around 10/09/2019).   >40 minutes spent today reviewing the medical chart, counseling the patient/family, and documenting today's encounter.   Caryl Pina [employer]  Charna Archer, MD

## 2019-07-20 ENCOUNTER — Other Ambulatory Visit: Payer: Self-pay | Admitting: Family

## 2019-07-20 DIAGNOSIS — F900 Attention-deficit hyperactivity disorder, predominantly inattentive type: Secondary | ICD-10-CM

## 2019-07-20 DIAGNOSIS — F411 Generalized anxiety disorder: Secondary | ICD-10-CM

## 2019-07-21 MED ORDER — AMPHETAMINE SULFATE 10 MG PO TABS: 10.0000 mg | ORAL_TABLET | Freq: Two times a day (BID) | ORAL | 0 refills | Status: DC

## 2019-07-21 MED ORDER — SERTRALINE HCL 100 MG PO TABS: 100.0000 mg | ORAL_TABLET | Freq: Every day | ORAL | 0 refills | Status: DC

## 2019-07-21 MED FILL — AMPHETAMINE SULFATE 10 MG T: 10 | 30 days supply | Qty: 120 | Fill #0

## 2019-07-21 MED FILL — SERTRALINE HCL 100 MG TAB: 100 | 90 days supply | Qty: 90 | Fill #0

## 2019-07-21 NOTE — Telephone Encounter (Signed)
RX for above e-scribed and sent to pharmacy on record   [place], [place] [address] [place] [address] Phone: [phone] Fax: [phone]

## 2019-08-01 ENCOUNTER — Other Ambulatory Visit (INDEPENDENT_AMBULATORY_CARE_PROVIDER_SITE_OTHER): Payer: Self-pay | Admitting: Pediatrics

## 2019-08-01 DIAGNOSIS — E1069 Type 1 diabetes mellitus with other specified complication: Secondary | ICD-10-CM

## 2019-08-01 MED ORDER — DEXCOM G6 SENSOR MISC: 1.0000 "application " | 3 refills | Status: DC | PRN

## 2019-08-01 NOTE — Telephone Encounter (Signed)
Both have been sent in.

## 2019-08-01 NOTE — Telephone Encounter (Signed)
  Who's calling (name and relationship to patient) : Amy ( Mom)  Best contact [phone]  Provider they see: Dr. Charna Archer  Reason for call: mom called to check on RX refill for both daughters she had appointment and was told the Dexcom had been ordered but pharmacy still saying they haven't received either refill     PRESCRIPTION REFILL ONLY  Name of prescription:  Pharmacy:[facility], [employer] education officer service)  [place]

## 2019-08-05 ENCOUNTER — Encounter (INDEPENDENT_AMBULATORY_CARE_PROVIDER_SITE_OTHER): Payer: Self-pay

## 2019-08-06 ENCOUNTER — Other Ambulatory Visit (INDEPENDENT_AMBULATORY_CARE_PROVIDER_SITE_OTHER): Payer: Self-pay

## 2019-08-06 DIAGNOSIS — E1069 Type 1 diabetes mellitus with other specified complication: Secondary | ICD-10-CM

## 2019-08-06 DIAGNOSIS — E109 Type 1 diabetes mellitus without complications: Secondary | ICD-10-CM

## 2019-08-06 DIAGNOSIS — E063 Autoimmune thyroiditis: Secondary | ICD-10-CM

## 2019-08-06 MED ORDER — DEXCOM G6 SENSOR MISC: 1.0000 "application " | 3 refills | Status: DC | PRN

## 2019-08-06 MED ORDER — DEXCOM G6 TRANSMITTER MISC: 3 refills | Status: DC

## 2019-08-06 MED ORDER — SYNTHROID 137 MCG PO TABS: 137.0000 ug | ORAL_TABLET | Freq: Every day | ORAL | 3 refills | Status: DC

## 2019-08-06 MED ORDER — INSULIN ASPART 100 UNIT/ML ~~LOC~~ SOLN: SUBCUTANEOUS | 3 refills | Status: DC

## 2019-08-06 MED ORDER — BAQSIMI TWO PACK 3 MG/DOSE NA POWD: 1.0000 "application " | NASAL | 1 refills | Status: DC | PRN

## 2019-09-16 ENCOUNTER — Encounter: Payer: BC Managed Care – PPO | Admitting: Family

## 2019-09-18 ENCOUNTER — Encounter: Payer: Self-pay | Admitting: Family

## 2019-09-18 ENCOUNTER — Ambulatory Visit (INDEPENDENT_AMBULATORY_CARE_PROVIDER_SITE_OTHER): Payer: BC Managed Care – PPO | Admitting: Family

## 2019-09-18 ENCOUNTER — Other Ambulatory Visit: Payer: Self-pay

## 2019-09-18 VITALS — BP 110/72 | HR 72 | Resp 16 | Ht 64.76 in | Wt 155.0 lb

## 2019-09-18 DIAGNOSIS — E063 Autoimmune thyroiditis: Secondary | ICD-10-CM

## 2019-09-18 DIAGNOSIS — Z79899 Other long term (current) drug therapy: Secondary | ICD-10-CM

## 2019-09-18 DIAGNOSIS — E7800 Pure hypercholesterolemia, unspecified: Secondary | ICD-10-CM | POA: Diagnosis not present

## 2019-09-18 DIAGNOSIS — E7801 Familial hypercholesterolemia: Secondary | ICD-10-CM

## 2019-09-18 DIAGNOSIS — Z7189 Other specified counseling: Secondary | ICD-10-CM

## 2019-09-18 DIAGNOSIS — F900 Attention-deficit hyperactivity disorder, predominantly inattentive type: Secondary | ICD-10-CM | POA: Diagnosis not present

## 2019-09-18 DIAGNOSIS — E049 Nontoxic goiter, unspecified: Secondary | ICD-10-CM

## 2019-09-18 DIAGNOSIS — Z719 Counseling, unspecified: Secondary | ICD-10-CM

## 2019-09-18 DIAGNOSIS — K900 Celiac disease: Secondary | ICD-10-CM

## 2019-09-18 NOTE — Progress Notes (Addendum)
[facility] [address] [place] [address] Dept: [phone] Dept Fax: [phone] Loc: [phone] Loc Fax: [phone]  Medication Check  Patient ID: Kiara Greer, female  DOB: 04/15/2000, 19 y.o.  MRN: [id number]  Date of Evaluation: 09/18/2019 PCP: Kandace Blitz, MD  Accompanied by: self Patient Lives with: parents  HISTORY/CURRENT STATUS: HPI Patient here by herself for the visit today. Patient interactive with provider and appropriate at the visit today. Patient did well last semester and mostly in person classes. 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 No classes this summer due to cancellation by the college. No recent health changes and updates for specialists reviewed. Patient getting some exercising in when able to and no changes to her dietary habits. Has continued with her medication regimen with no side effects reported.   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   MEDICAL HISTORY: Appetite: Good with no changes  MVI/Other: MVI and Vitamin D   Sleep: Bedtime: 12:00 am   Awakens: 7:30-8:00 am  Concerns: Initiation/Maintenance/Other: None  Individual Medical History/ Review of Systems: Changes? :Yes, had f/u with Endocrine in April with no medication changes.   Allergies: Patient has no known allergies.  Current Medications:  Current Outpatient Medications:  .  acetone, urine, test strip, Check ketones per protocol, Disp: 50 each, Rfl: 3 .  Amphetamine Sulfate (EVEKEO) 10 MG TABS, Take 10-20 mg  by mouth 2 (two) times daily., Disp: 120 tablet, Rfl: 0 .  Calcium-Vitamin D-Vitamin K (VIACTIV) 903-009-23 MG-UNT-MCG CHEW, Chew by mouth.  , Disp: , Rfl:  .  cetirizine (ZYRTEC) 10 MG tablet, Take 10 mg by mouth as needed for allergies., Disp: , Rfl:  .  Continuous Blood Gluc Sensor (DEXCOM G6 SENSOR) MISC, Inject 1 application into the skin as needed (Apply new sensor every 10 days)., Disp: 9 each, Rfl: 3 .  Continuous Blood Gluc Transmit (DEXCOM G6 TRANSMITTER) MISC, CHANGE EVERY 3 MONTHS, Disp: 1 each, Rfl: 3 .  Glucagon (BAQSIMI TWO PACK) 3 MG/DOSE POWD, Place 1 application into the nose as needed. Use as directed if unconscious, unable to take food po, or having a seizure due to hypoglycemia, Disp: 2 each, Rfl: 1 .  insulin aspart (NOVOLOG) 100 UNIT/ML injection, 300 units in  Insulin Pump every 48 hours, Disp: 150 mL, Rfl: 3 .  Multiple Vitamin (MULTIVITAMIN) tablet, Take 1 tablet by mouth daily.  , Disp: , Rfl:  .  norethindrone-ethinyl estradiol (JUNEL FE 1/20) 1-20 MG-MCG tablet, Take 1 tablet by mouth daily., Disp: 84 tablet, Rfl: 3 .  rosuvastatin (CRESTOR) 10 MG tablet, Take 1 tablet (10 mg total) by mouth daily., Disp: 90 tablet, Rfl: 3 .  sertraline (ZOLOFT) 100 MG tablet, Take 1 tablet (100 mg total) by mouth daily., Disp: 90 tablet, Rfl: 0 .  SYNTHROID 137 MCG tablet, Take 1 tablet (137 mcg total) by mouth daily. Brand name medically necessary, Disp: 90 tablet, Rfl: 3 .  albuterol (VENTOLIN HFA) 108 (90 Base) MCG/ACT inhaler, 1-2 inhalations every 4-6 hours as needed for wheezing. Dispense spacer as needed., Disp: , Rfl:  .  fluticasone (FLONASE) 50 MCG/ACT nasal spray, 2 sprays in each nostril daily x1  week then 1-2 sprays in each nostril daily. (use smallest dose possible for symptom control after week 1)., Disp: , Rfl:  .  olopatadine (PATANOL) 0.1 % ophthalmic solution, , Disp: , Rfl:  .  PROAIR HFA 108 (90 Base) MCG/ACT inhaler, 1-2 INHALATIONS EVERY 4-6 HOURS AS NEEDED FOR  WHEEZING. DISPENSE SPACER AS NEEDED. (Patient not taking: Reported on 09/18/2019), Disp: , Rfl: 0 Medication Side Effects: None  Family Medical/ Social History: Changes? None reported recently  MENTAL HEALTH: Mental Health Issues: Depression and Anxiety-Zoloft 100 mg daily, with good symptom control with medication. No suicidal thoughts or ideations. No counseling at this time.   PHYSICAL EXAM; Vitals:  General Physical Exam: Unchanged from previous exam, date: None Changed: None  Testing/Developmental Screens:  DIAGNOSES:    ICD-10-CM   1. ADHD (attention deficit hyperactivity disorder), inattentive type  F90.0   2. Hyperlipidemia type II  E78.01   3. Hypercholesterolemia  E78.00   4. 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 Celiac disease  K90.0   6. Goiter  E04.9   7. Hashimoto's thyroiditis  E06.3   8. Medication management  Z79.899   9. Patient counseled  Z71.9   10. Goals of care, counseling/discussion  Z71.89     RECOMMENDATIONS:  Counseling at this visit included the review of old records and/or current chart with the patient with updates with school, work, internship, and babysitting with heath and medications.  Discussed recent history and today's examination with patient with no changes today on exam.   Counseled regarding health and recent updates with specialists since last f/u visit.   Recommended a high protein, low sugar diet, watch portion sizes, avoid second helpings, avoid sugary snacks and drinks, drink more water, eat more fruits and vegetables, increase daily exercise.  Discussed school academic and behavioral progress and advocated for appropriate accommodations needed for support as needed on campus.   Discussed importance of maintaining structure, routine, organization, reward, motivation and consequences with consistency with school, home and work settings.   Counseled medication pharmacokinetics, options, dosage, administration, desired  effects, and possible side effects.   Zoloft 100 mg daily, no Rx today Evekeo 10 mg 1-2 tablets daily, no Rx today  Advised importance of:  Good sleep hygiene (8- 10 hours per night, no TV or video games for 1 hour before bedtime) Limited screen time (none on school nights, no more than 2 hours/day on weekends, use of screen time for motivation) Regular exercise(outside and active play) Healthy eating (drink water or milk, no sodas/sweet tea, limit portions and no seconds).  NEXT APPOINTMENT: Return in about 3 months (around 12/19/2019) for f/u visit.  Medical Decision-making: More than 50% of the appointment was spent counseling and discussing diagnosis and management of symptoms with the patient and family.  Carolann Littler, NP Counseling Time: 25 mins Total Contact Time: 30 mins

## 2019-09-22 DIAGNOSIS — E119 Type 2 diabetes mellitus without complications: Secondary | ICD-10-CM | POA: Diagnosis not present

## 2019-09-22 DIAGNOSIS — H5000 Unspecified esotropia: Secondary | ICD-10-CM | POA: Diagnosis not present

## 2019-09-22 LAB — HM DIABETES EYE EXAM

## 2019-09-24 DIAGNOSIS — E1065 Type 1 diabetes mellitus with hyperglycemia: Secondary | ICD-10-CM | POA: Diagnosis not present

## 2019-12-02 DIAGNOSIS — E1065 Type 1 diabetes mellitus with hyperglycemia: Secondary | ICD-10-CM | POA: Diagnosis not present

## 2019-12-10 ENCOUNTER — Encounter: Payer: Self-pay | Admitting: Family

## 2019-12-15 ENCOUNTER — Telehealth (INDEPENDENT_AMBULATORY_CARE_PROVIDER_SITE_OTHER): Payer: BC Managed Care – PPO | Admitting: Family

## 2019-12-15 ENCOUNTER — Encounter: Payer: Self-pay | Admitting: Family

## 2019-12-15 DIAGNOSIS — E063 Autoimmune thyroiditis: Secondary | ICD-10-CM

## 2019-12-15 DIAGNOSIS — F411 Generalized anxiety disorder: Secondary | ICD-10-CM

## 2019-12-15 DIAGNOSIS — E7800 Pure hypercholesterolemia, unspecified: Secondary | ICD-10-CM

## 2019-12-15 DIAGNOSIS — E049 Nontoxic goiter, unspecified: Secondary | ICD-10-CM

## 2019-12-15 DIAGNOSIS — Z79899 Other long term (current) drug therapy: Secondary | ICD-10-CM

## 2019-12-15 DIAGNOSIS — F900 Attention-deficit hyperactivity disorder, predominantly inattentive type: Secondary | ICD-10-CM

## 2019-12-15 DIAGNOSIS — E7801 Familial hypercholesterolemia: Secondary | ICD-10-CM | POA: Diagnosis not present

## 2019-12-15 DIAGNOSIS — F819 Developmental disorder of scholastic skills, unspecified: Secondary | ICD-10-CM

## 2019-12-15 DIAGNOSIS — Z7189 Other specified counseling: Secondary | ICD-10-CM

## 2019-12-15 DIAGNOSIS — Z719 Counseling, unspecified: Secondary | ICD-10-CM

## 2019-12-15 DIAGNOSIS — H5089 Other specified strabismus: Secondary | ICD-10-CM

## 2019-12-15 DIAGNOSIS — K900 Celiac disease: Secondary | ICD-10-CM

## 2019-12-15 MED ORDER — SERTRALINE HCL 100 MG PO TABS: 100.0000 mg | ORAL_TABLET | Freq: Every day | ORAL | 0 refills | Status: DC

## 2019-12-15 MED FILL — SERTRALINE HCL 100 MG TABS: 100 | 90 days supply | Qty: 90 | Fill #0

## 2019-12-15 NOTE — Progress Notes (Signed)
[facility] [place] [place] [address] Dept: [phone] Dept Fax: [phone]  Medication Check visit via Virtual Video due to COVID-19  Patient ID:  Kiara Greer  female DOB: [birth year]   19 y.o.   MRN: [id number]   DATE:12/15/19  PCP: Kandace Blitz, MD  Virtual Visit via Video Note  I connected with  Kiara Greer  on 12/15/19 at 11:00 AM EDT by a video enabled telemedicine application and verified that I am speaking with the correct person using two identifiers. Patient/Parent Location: at school   I discussed the limitations, risks, security and privacy concerns of performing an evaluation and management service by telephone and the availability of in person appointments. I also discussed with the parents that there may be a patient responsible charge related to this service. The parents expressed understanding and agreed to proceed.  Provider: Carolann Littler, NP  Location: private location  HISTORY/CURRENT STATUS: Kiara Greer is here for medication management of the psychoactive medications for ADHD and review of educational and behavioral concerns.   Kiara Greer currently taking Zoloft and Evekeo, which is working well. Takes medication as directed. Medication tends to wear off around evening time for the [facility]. Kiara Greer is able to focus through school/homework.   Kiara Greer is eating well (eating breakfast, lunch and dinner). Eating with no changes reported.   Sleeping well (trying to get back into a routine), sleeping through the night.   EDUCATION: School: [employer] Year/Grade: Junior in [employer]  Performance/ Grades: average Services: Other: Disability Services 2 on campus jobs [employer] now  Activities/ Exercise: intermittently-walking on campus, gym more recently  Screen time: (phone, tablet, TV, computer): computer for learning, phone, TV and movies.   MEDICAL  HISTORY: Individual Medical History/ Review of Systems: Changes? :No recent updates with endocrine or GYN.   Family Medical/ Social History: Changes? No Patient Lives with: parents and sister when at home, and off campus with 3 roommates.   Current Medications:  Current Outpatient Medications  Medication Instructions  . acetone, urine, test strip Check ketones per protocol  . albuterol (VENTOLIN HFA) 108 (90 Base) MCG/ACT inhaler 1-2 inhalations every 4-6 hours as needed for wheezing. Dispense spacer as needed.  . Amphetamine Sulfate (EVEKEO) 10-20 mg, Oral, 2 times daily  . Calcium-Vitamin D-Vitamin K (VIACTIV) 654-650-35 MG-UNT-MCG CHEW Chew by mouth.    . cetirizine (ZYRTEC) 10 mg, Oral, As needed  . Continuous Blood Gluc Sensor (DEXCOM G6 SENSOR) MISC 1 application, Subcutaneous, As needed  . Continuous Blood Gluc Transmit (DEXCOM G6 TRANSMITTER) MISC CHANGE EVERY 3 MONTHS  . fluticasone (FLONASE) 50 MCG/ACT nasal spray 2 sprays in each nostril daily x1 week then 1-2 sprays in each nostril daily. (use smallest dose possible for symptom control after week 1).  . Glucagon (BAQSIMI TWO PACK) 3 MG/DOSE POWD 1 application, Nasal, As needed, Use as directed if unconscious, unable to take food po, or having a seizure due to hypoglycemia  . insulin aspart (NOVOLOG) 100 UNIT/ML injection 300 units in  Insulin Pump every 48 hours  . Multiple Vitamin (MULTIVITAMIN) tablet 1 tablet, Daily  . norethindrone-ethinyl estradiol (JUNEL FE 1/20) 1-20 MG-MCG tablet 1 tablet, Oral, Daily  . olopatadine (PATANOL) 0.1 % ophthalmic solution No dose, route, or frequency recorded.  Marland Kitchen PROAIR HFA 108 (90 Base) MCG/ACT inhaler 1-2 INHALATIONS EVERY 4-6 HOURS AS NEEDED FOR WHEEZING. DISPENSE SPACER AS NEEDED.  Marland Kitchen rosuvastatin (CRESTOR) 10 mg, Oral, Daily  .  sertraline (ZOLOFT) 100 mg, Oral, Daily  . Synthroid 137 mcg, Oral, Daily, Brand name medically necessary   Medication Side Effects: None  MENTAL  HEALTH: Mental Health Issues:   Anxiety-Zoloft controlling anxiety symptoms well with no side effect or suicidal thoughts or ideations.   DIAGNOSES:    ICD-10-CM   1. ADHD (attention deficit hyperactivity disorder), inattentive type  F90.0   2. Generalized anxiety disorder  F41.1 sertraline (ZOLOFT) 100 MG tablet  3. 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 Hyperlipidemia type II  E78.01   5. Hypercholesterolemia  E78.00   6. Celiac disease  K90.0   7. Hashimoto's thyroiditis  E06.3   8. Goiter  E04.9   9. 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 Problems with learning  F81.9   11. Medication management  Z79.899   12. Patient counseled  Z71.9   13. Goals of care, counseling/discussion  Z71.89     RECOMMENDATIONS:  Discussed recent history with patient with updates for school classes, academics, learning, health and medications.   Discussed school academic progress and recommended continued accommodations   Referred to [employer] for resources about using distance learning with children with ADHD  Children and young adults with ADHD often suffer from disorganization, difficulty with time management, completing projects and other executive function difficulties.  Recommended Reading: "Smart but Scattered" and "Smart but Scattered Teens" by Peg Renato Battles and Ethelene Browns.    Discussed growth and development and current weight. Recommended healthy food choices, watching portion sizes, avoiding second helpings, avoiding sugary drinks like soda and tea, drinking more water, getting more exercise.   Recommended making each meal calorie dense by increasing calories in foods like using whole milk and 4% yogurt, adding butter and sour cream. Encourage foods like lunch meat, peanut butter and cheese. Offer afternoon and bedtime snacks when appetite is not suppressed by the medicine. Encourage healthy meal choices, not just snacking on junk.   Discussed continued need for  structure, routine, reward (external), motivation (internal), positive reinforcement, consequences, and organization  Encouraged recommended limitations on TV, tablets, phones, video games and computers for non-educational activities.   Discussed need for bedtime routine, use of good sleep hygiene, no video games, TV or phones for an hour before bedtime.   Encouraged physical activity and outdoor play, maintaining social distancing.   Counseled medication pharmacokinetics, options, dosage, administration, desired effects, and possible side effects.   Zoloft 100 mg daily, # 90 with no RF's. Evekeo 10 mg 1-2 daily, no Rx today RX for above e-scribed and sent to pharmacy on record  [place], [place] [address] Phone: [phone] Fax: [phone]  I discussed the assessment and treatment plan with the patient/parent. The patient/parent was provided an opportunity to ask questions and all were answered. The patient/ parent agreed with the plan and demonstrated an understanding of the instructions.   I provided 20 minutes of non-face-to-face time during this encounter.  Completed record review for 10 minutes prior to the virtual video visit.   NEXT APPOINTMENT:  Return in about 3 months (around 03/15/2020) for f/u visit.  The patient/parent was advised to call back or seek an in-person evaluation if the symptoms worsen or if the condition fails to improve as anticipated.  Medical Decision-making: More than 50% of the appointment was spent counseling and discussing diagnosis and management of symptoms with the patient and family.  Carolann Littler, NP

## 2020-01-27 DIAGNOSIS — Z23 Encounter for immunization: Secondary | ICD-10-CM | POA: Diagnosis not present

## 2020-03-22 ENCOUNTER — Other Ambulatory Visit (INDEPENDENT_AMBULATORY_CARE_PROVIDER_SITE_OTHER): Payer: Self-pay

## 2020-03-22 DIAGNOSIS — E1069 Type 1 diabetes mellitus with other specified complication: Secondary | ICD-10-CM

## 2020-03-23 ENCOUNTER — Ambulatory Visit (INDEPENDENT_AMBULATORY_CARE_PROVIDER_SITE_OTHER): Payer: BC Managed Care – PPO | Admitting: Pediatrics

## 2020-03-23 ENCOUNTER — Encounter (INDEPENDENT_AMBULATORY_CARE_PROVIDER_SITE_OTHER): Payer: Self-pay | Admitting: Pediatrics

## 2020-03-23 ENCOUNTER — Other Ambulatory Visit: Payer: Self-pay

## 2020-03-23 VITALS — BP 111/67 | HR 82 | Wt 152.6 lb

## 2020-03-23 DIAGNOSIS — Z9641 Presence of insulin pump (external) (internal): Secondary | ICD-10-CM | POA: Diagnosis not present

## 2020-03-23 DIAGNOSIS — K900 Celiac disease: Secondary | ICD-10-CM | POA: Diagnosis not present

## 2020-03-23 DIAGNOSIS — E063 Autoimmune thyroiditis: Secondary | ICD-10-CM | POA: Diagnosis not present

## 2020-03-23 DIAGNOSIS — E1069 Type 1 diabetes mellitus with other specified complication: Secondary | ICD-10-CM

## 2020-03-23 DIAGNOSIS — E785 Hyperlipidemia, unspecified: Secondary | ICD-10-CM

## 2020-03-23 DIAGNOSIS — N921 Excessive and frequent menstruation with irregular cycle: Secondary | ICD-10-CM

## 2020-03-23 LAB — MICROALBUMIN / CREATININE URINE RATIO
Creatinine, Urine: 132 mg/dL (ref 20–275)
Microalb Creat Ratio: 17 mcg/mg creat (ref <30)
Microalb, Ur: 2.2 mg/dL

## 2020-03-23 NOTE — Progress Notes (Signed)
Pediatric Endocrinology Diabetes Consultation Follow-up Visit  Kiara Greer 04-13-2000 [id number]  Chief Complaint: Follow-up type 1 diabetes, acquired hypothyroidism, celiac disease, abnormal lipids, menorrhagia  Kiara Blitz, MD   HPI: Kiara Greer is a 19 y.o. female presenting for follow-up of the above concerns.  she is accompanied to this visit by her mother.     42. Kiara Greer was diagnosed with type 1 diabetes mellitus at age 88 months in 2002; she was not in DKA at diagnosis.  She was initially followed at [facility] pediatric endocrinology. She was started on an insulin pump one week after diagnosis.  She most recently upgraded to a medtronic 530G pump in Summer 2016.  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 Celiac disease was diagnosed in 2008. She was referred to our clinic on 01/14/09 at age [age] years.  Her mother and younger sister, Kiara Greer also had T1DM and celiac disease. Mother was also hypothyroid then. Sister Kiara Greer was initially euthyroid, but developed hypothyroidism in 2012.She started using a dexcom CGM in 04/2016. She transitioned to a Tslim pump in 11/2018.  2. Since last visit on 07/10/19 (telehealth), she has been well.   ED visits/Hospitalizations: No   Concerns:  -Mom worried she doesn't always wear her dexcom.  Will be studying abroad in [place] next semester  Insulin regimen: Tslim settings  Time Basal Rate Correction factor Carb ratio Target BG   12a 1.0 35 mg/dL 9 120 mg/dL  6a 1.1 30 mg/dL 9 110 mg/dL  8a 1.35 30 mg/dL 9  110 mg/dL  12p 1.35 30 mg/dL 9 110 mg/dL  10p 1.35 35 mg/dL 9 120 mg/dL   Total 29.8 Units       CGM download: Avg BG: 228 High 26% of the time, In range 73% of the time, low 0% of the time Patterns: Very variable, no patterns  Avg daily carb intake 4 grams (not entering carbs).  Avg total daily insulin 74.2 units (49% basal, 51% bolus, majority of boluses are control IQ auto  boluses  Hypoglycemia: Able to feel low blood sugars.  No glucagon needed recently.  Wearing Med-alert ID currently: Not wearing.  Encouraged to wear one while in [place] Injection sites: CGM on arms, pump site on back/butt Annual labs due: 03/2021-drawn today Ophthalmology due: 09/2019 (Performed 09/2018 with Dr. Prudencio Burly, no retinopathy)  Acquired hypothyroidism: She takes synthroid 113mg daily (increased 10/2017).   Missing doses: many.  Has not been taking it since home from school as she has been out of her routine Energy level: OK, mom thinks she is tired alot Sleep: not great Periods regular: yes, on OCPs below  Celiac disease: Continues on a gluten free diet majority of the time  Weight changes: Weight has increased 16lb since last in person visit in 01/2018.  No recent change in weight per Kiara Greer     Hyperlipidemia: She continues on crestor 145mdaily (increased in 10/2018) and a plant based sterol called cholest-off.  Muscle aches: None Has not been taking crestor regularly  Menorrhagia with irregular cycles:  Started on OCPs in 12/2016 for menorrhagia with irregular cycles with improvement in menses though these were stopped in 03/2017 due to concerns they were contributing to depression.  Menses resumed very heavy so she was restarted on OCPs in 07/2017 without change in depression.   Continues on Junel Fe 1/20 Withdrawal bleeding during week of inactive pills: yes Spotting: no   ROS: All systems reviewed with pertinent positives listed below;  otherwise negative.    Past Medical History:   Past Medical History:  Diagnosis Date  . ADHD (attention deficit hyperactivity disorder)   . Celiac disease   . Diabetes mellitus type I (Kiara Greer)   . Duanes syndrome   . Goiter   . Hashimoto's thyroiditis   . Hyperlipidemia type II   . Hypoglycemia associated with diabetes ([place])   . Hypoglycemia unawareness in type 1 diabetes mellitus ([place])   . Hypothyroidism, acquired, autoimmune      Medications:  Outpatient Encounter Medications as of 03/23/2020  Medication Sig Note  . acetone, urine, test strip Check ketones per protocol   . albuterol (VENTOLIN HFA) 108 (90 Base) MCG/ACT inhaler 1-2 inhalations every 4-6 hours as needed for wheezing. Dispense spacer as needed. 10/10/2018: Keeps for PRN use  . Amphetamine Sulfate (EVEKEO) 10 MG TABS Take 10-20 mg by mouth 2 (two) times daily.   . Calcium-Vitamin D-Vitamin K (VIACTIV) 563-149-70 MG-UNT-MCG CHEW Chew by mouth.     . cetirizine (ZYRTEC) 10 MG tablet Take 10 mg by mouth as needed for allergies.   . Continuous Blood Gluc Sensor (DEXCOM G6 SENSOR) MISC Inject 1 application into the skin as needed (Apply new sensor every 10 days).   . Continuous Blood Gluc Transmit (DEXCOM G6 TRANSMITTER) MISC CHANGE EVERY 3 MONTHS   . fluticasone (FLONASE) 50 MCG/ACT nasal spray 2 sprays in each nostril daily x1 week then 1-2 sprays in each nostril daily. (use smallest dose possible for symptom control after week 1).   . Glucagon (BAQSIMI TWO PACK) 3 MG/DOSE POWD Place 1 application into the nose as needed. Use as directed if unconscious, unable to take food po, or having a seizure due to hypoglycemia   . insulin aspart (NOVOLOG) 100 UNIT/ML injection 300 units in  Insulin Pump every 48 hours   . Multiple Vitamin (MULTIVITAMIN) tablet Take 1 tablet by mouth daily.     . norethindrone-ethinyl estradiol (JUNEL FE 1/20) 1-20 MG-MCG tablet Take 1 tablet by mouth daily.   Marland Kitchen olopatadine (PATANOL) 0.1 % ophthalmic solution    . PROAIR HFA 108 (90 Base) MCG/ACT inhaler 1-2 INHALATIONS EVERY 4-6 HOURS AS NEEDED FOR WHEEZING. DISPENSE SPACER AS NEEDED. 10/10/2018: Keeps for PRN use  . rosuvastatin (CRESTOR) 10 MG tablet Take 1 tablet (10 mg total) by mouth daily.   . sertraline (ZOLOFT) 100 MG tablet Take 1 tablet (100 mg total) by mouth daily.   Marland Kitchen SYNTHROID 137 MCG tablet Take 1 tablet (137 mcg total) by mouth daily. Brand name medically necessary    No  facility-administered encounter medications on file as of 03/23/2020.  Taking an OTC tablet called cholest-off for hyperlipidemia (contains plant sterols)  Allergies: No Known Allergies  Surgical History: Past Surgical History:  Procedure Laterality Date  . NO PAST SURGERIES      Family History:  Family History  Problem Relation Age of Onset  . Diabetes Mother   . Thyroid disease Mother   . Celiac disease Mother   . Diabetes Sister   . Thyroid disease Sister   . Celiac disease Sister   . Diabetes Maternal Uncle   . Thyroid disease Maternal Grandmother   . Celiac disease Maternal Grandmother   . Cancer Neg Hx     Social History: Attends [employer], studying abroad in [place] spring 2022  Physical Exam:  Vitals:   03/23/20 1031  BP: 111/67  Pulse: 82  Weight: 152 lb 9.6 oz (69.2 kg)   BP 111/67   Pulse  82   Wt 152 lb 9.6 oz (69.2 kg)   BMI 25.58 kg/m  Body mass index: body mass index is 25.58 kg/m. Growth percentile SmartLinks can only be used for patients less than 92 years old.  Ht Readings from Last 3 Encounters:  07/10/19 5' 5"  (1.651 m)  01/31/18 5' 4.57" (1.64 m) (55 %, Z= 0.12)*  10/25/17 5' 5.35" (1.66 m) (67 %, Z= 0.43)*   * Growth percentiles are based on CDC (Girls, 2-20 Years) data.   Wt Readings from Last 3 Encounters:  03/23/20 152 lb 9.6 oz (69.2 kg)  01/31/18 136 lb (61.7 kg) (68 %, Z= 0.46)*  10/25/17 128 lb 9.6 oz (58.3 kg) (57 %, Z= 0.18)*   * Growth percentiles are based on CDC (Girls, 2-20 Years) data.   General: Well developed, well nourished female in no acute distress.  Appears stated age Head: Normocephalic, atraumatic.   Eyes:  Pupils equal and round. Sclera white.  No eye drainage.   Ears/Nose/Mouth/Throat: Masked Neck: supple, no cervical lymphadenopathy, no thyromegaly Cardiovascular: regular rate, normal S1/S2, no murmurs Respiratory: No increased work of breathing.  Lungs clear to auscultation bilaterally.  No wheezes. Abdomen:  soft, nontender, nondistended.  Extremities: warm, well perfused, cap refill < 2 sec.   Musculoskeletal: Normal muscle mass.  Normal strength Skin: warm, dry.  No rash or lesions. Neurologic: alert and oriented, normal speech, no tremor   Labs: Hemoglobin A1c trend: 9.3%-->10.7%-->10.2% -->9.3%-->8.6%-->8.3% 12/2016-->11.2% 04/2017-->9.5% 07/2017-->8.9% 10/2017-->11% 01/2018, 11.4% 10/2018--> pending 03/2020   Assessment/Plan: Lurlie Wigen is a 19 y.o. female with T1DM on a pump and CGM regimen (Tslim control IQ).   A1c is pending and is likely above the ADA goal of <7.5%.  She continues on synthroid for hypothyroidism. She continues to follow a gluten-free diet most of the time and has no GI symptoms.  She also continues on rosuvastatin for hyperlipidemia (taking intermittently).  She is doing well on low dose OCP.  When a patient is on insulin, intensive monitoring of blood glucose levels and continuous insulin titration is vital to avoid insulin toxicity leading to severe hypoglycemia. Severe hypoglycemia can lead to seizure or death. Hyperglycemia can also result from inadequate insulin dosing and can lead to ketosis requiring ICU admission and intravenous insulin.   1. Type 1 diabetes without complications ([place]) -Will draw annual diabetes labs today (lipid panel, TSH, FT4, urine microalbumin to creatinine ratio) -Encouraged to wear med alert ID every day -Encouraged to rotate injection sites.  Wear dexcom while in [place]! -Provided with my contact information and advised to email/send mychart with questions/need for BG review -CGM download reviewed extensively (see interpretation above) -Provided with tresiba and Fiasp sample pens should pump fail in [place].  Will send rx to local pharmacy for novolog pen as well.     2.  Insulin Pump in Place -No pump changes today  3. Acquired autoimmune hypothyroidism -Continue current levothyroxine.   4. Celiac disease -Continue gluten free  diet  5. Hyperlipidemia due to type 1 diabetes mellitus (HCC) -Continue rosuvastatin  6. Menorrhagia with irregular cycle -Continue current OCP  Follow-up:   Return in about 3 months (around 06/21/2020).   >40 minutes spent today reviewing the medical chart, counseling the patient/family, and documenting today's encounter.  Levon Hedger, MD  -------------------------------- 03/24/20 7:14 AM ADDENDUM:  Urine microalbumin to creatinine ratio normal.    TSH elevated, likely due to frequent missed doses of synthroid.  Continue current synthroid dose but work on taking  it more consistently.  Lipid panel much worse, not taking crestor as she should be.  Work on taking crestor daily.   CMP unremarkable.  A1c 11.1%; improved from last visit but still too high.  Wear dexcom with pump so it can help get blood sugar numbers better!  Mychart sent to patient with results/recommendations.   Results for orders placed or performed in visit on 03/22/20  T3, free  Result Value Ref Range   T3, Free 3.4 3.0 - 4.7 pg/mL  Microalbumin / creatinine urine ratio  Result Value Ref Range   Creatinine, Urine 132 20 - 275 mg/dL   Microalb, Ur 2.2 mg/dL   Microalb Creat Ratio 17 <30 mcg/mg creat  Comprehensive metabolic panel  Result Value Ref Range   Glucose, Bld 131 (H) 65 - 99 mg/dL   BUN 7 7 - 25 mg/dL   Creat 0.58 0.50 - 1.10 mg/dL   BUN/Creatinine Ratio NOT APPLICABLE 6 - 22 (calc)   Sodium 138 135 - 146 mmol/L   Potassium 4.2 3.5 - 5.3 mmol/L   Chloride 102 98 - 110 mmol/L   CO2 28 20 - 32 mmol/L   Calcium 9.7 8.6 - 10.2 mg/dL   Total Protein 6.6 6.1 - 8.1 g/dL   Albumin 4.1 3.6 - 5.1 g/dL   Globulin 2.5 1.9 - 3.7 g/dL (calc)   AG Ratio 1.6 1.0 - 2.5 (calc)   Total Bilirubin 0.5 0.2 - 1.2 mg/dL   Alkaline phosphatase (APISO) 95 31 - 125 U/L   AST 9 (L) 10 - 30 U/L   ALT 7 6 - 29 U/L  Lipid panel  Result Value Ref Range   Cholesterol 296 (H) <200 mg/dL   HDL 80 > OR = 50  mg/dL   Triglycerides 93 <150 mg/dL   LDL Cholesterol (Calc) 195 (H) mg/dL (calc)   Total CHOL/HDL Ratio 3.7 <5.0 (calc)   Non-HDL Cholesterol (Calc) 216 (H) <130 mg/dL (calc)  Hemoglobin A1c  Result Value Ref Range   Hgb A1c MFr Bld 11.1 (H) <5.7 % of total Hgb   Mean Plasma Glucose 272 mg/dL   eAG (mmol/L) 15.1 mmol/L  TSH  Result Value Ref Range   TSH 10.45 (H) mIU/L  T4, free  Result Value Ref Range   Free T4 1.2 0.8 - 1.4 ng/dL  T4  Result Value Ref Range   T4, Total 9.0 5.3 - 11.7 mcg/dL

## 2020-03-23 NOTE — Patient Instructions (Signed)

## 2020-03-24 ENCOUNTER — Encounter (INDEPENDENT_AMBULATORY_CARE_PROVIDER_SITE_OTHER): Payer: Self-pay | Admitting: Pediatrics

## 2020-03-24 DIAGNOSIS — E1065 Type 1 diabetes mellitus with hyperglycemia: Secondary | ICD-10-CM | POA: Diagnosis not present

## 2020-03-24 LAB — COMPREHENSIVE METABOLIC PANEL
AG Ratio: 1.6 (calc) (ref 1.0–2.5)
ALT: 7 U/L (ref 6–29)
AST: 9 U/L — ABNORMAL LOW (ref 10–30)
Albumin: 4.1 g/dL (ref 3.6–5.1)
Alkaline phosphatase (APISO): 95 U/L (ref 31–125)
BUN: 7 mg/dL (ref 7–25)
CO2: 28 mmol/L (ref 20–32)
Calcium: 9.7 mg/dL (ref 8.6–10.2)
Chloride: 102 mmol/L (ref 98–110)
Creat: 0.58 mg/dL (ref 0.50–1.10)
Globulin: 2.5 g/dL (calc) (ref 1.9–3.7)
Glucose, Bld: 131 mg/dL — ABNORMAL HIGH (ref 65–99)
Potassium: 4.2 mmol/L (ref 3.5–5.3)
Sodium: 138 mmol/L (ref 135–146)
Total Bilirubin: 0.5 mg/dL (ref 0.2–1.2)
Total Protein: 6.6 g/dL (ref 6.1–8.1)

## 2020-03-24 LAB — HEMOGLOBIN A1C
Hgb A1c MFr Bld: 11.1 % of total Hgb — ABNORMAL HIGH (ref <5.7)
Mean Plasma Glucose: 272 mg/dL
eAG (mmol/L): 15.1 mmol/L

## 2020-03-24 LAB — LIPID PANEL
Cholesterol: 296 mg/dL — ABNORMAL HIGH (ref <200)
HDL: 80 mg/dL (ref >=50)
LDL Cholesterol (Calc): 195 mg/dL (calc) — ABNORMAL HIGH
Non-HDL Cholesterol (Calc): 216 mg/dL (calc) — ABNORMAL HIGH (ref <130)
Total CHOL/HDL Ratio: 3.7 (calc) (ref <5.0)
Triglycerides: 93 mg/dL (ref <150)

## 2020-03-24 LAB — T4, FREE: Free T4: 1.2 ng/dL (ref 0.8–1.4)

## 2020-03-24 LAB — T4: T4, Total: 9 ug/dL (ref 5.3–11.7)

## 2020-03-24 LAB — TSH: TSH: 10.45 mIU/L — ABNORMAL HIGH

## 2020-03-24 LAB — T3, FREE: T3, Free: 3.4 pg/mL (ref 3.0–4.7)

## 2020-03-24 MED ORDER — NOVOLOG FLEXPEN 100 UNIT/ML ~~LOC~~ SOPN: PEN_INJECTOR | SUBCUTANEOUS | 6 refills | Status: DC

## 2020-03-29 ENCOUNTER — Telehealth (INDEPENDENT_AMBULATORY_CARE_PROVIDER_SITE_OTHER): Payer: BC Managed Care – PPO | Admitting: Family

## 2020-03-29 ENCOUNTER — Telehealth: Payer: Self-pay | Admitting: Family

## 2020-03-29 ENCOUNTER — Encounter: Payer: Self-pay | Admitting: Family

## 2020-03-29 ENCOUNTER — Other Ambulatory Visit: Payer: Self-pay

## 2020-03-29 DIAGNOSIS — R278 Other lack of coordination: Secondary | ICD-10-CM | POA: Diagnosis not present

## 2020-03-29 DIAGNOSIS — E063 Autoimmune thyroiditis: Secondary | ICD-10-CM

## 2020-03-29 DIAGNOSIS — F819 Developmental disorder of scholastic skills, unspecified: Secondary | ICD-10-CM

## 2020-03-29 DIAGNOSIS — E7800 Pure hypercholesterolemia, unspecified: Secondary | ICD-10-CM

## 2020-03-29 DIAGNOSIS — K900 Celiac disease: Secondary | ICD-10-CM

## 2020-03-29 DIAGNOSIS — E049 Nontoxic goiter, unspecified: Secondary | ICD-10-CM

## 2020-03-29 DIAGNOSIS — E7801 Familial hypercholesterolemia: Secondary | ICD-10-CM

## 2020-03-29 DIAGNOSIS — E11649 Type 2 diabetes mellitus with hypoglycemia without coma: Secondary | ICD-10-CM

## 2020-03-29 DIAGNOSIS — E1010 Type 1 diabetes mellitus with ketoacidosis without coma: Secondary | ICD-10-CM

## 2020-03-29 DIAGNOSIS — F900 Attention-deficit hyperactivity disorder, predominantly inattentive type: Secondary | ICD-10-CM

## 2020-03-29 DIAGNOSIS — F411 Generalized anxiety disorder: Secondary | ICD-10-CM

## 2020-03-29 MED ORDER — AMPHETAMINE SULFATE 10 MG PO TABS: 10.0000 mg | ORAL_TABLET | Freq: Two times a day (BID) | ORAL | 0 refills | Status: DC

## 2020-03-29 MED ORDER — SERTRALINE HCL 100 MG PO TABS: 100.0000 mg | ORAL_TABLET | Freq: Every day | ORAL | 0 refills | Status: DC

## 2020-03-29 NOTE — Telephone Encounter (Signed)
Evekeo 10 mg 1-2 daily, # 240 with no RF's for 4 month supply and Zoloft 100 mg daily, # 120 with no RF's for 4 month supply for study abroad.RX for above e-scribed and sent to pharmacy on record  [place], [place] [address] [place] [address] Phone: [phone] Fax: [phone]

## 2020-03-29 NOTE — Progress Notes (Signed)
[facility] [address] [place] [place] [address] Dept: [phone] Dept Fax: [phone]  Medication Check visit via Virtual Video   Patient ID:  Kiara Greer  female DOB: [birth year]   19 y.o.   MRN: [id number]   DATE:03/29/20  PCP: Santa Genera, MD  Virtual Visit via Video Note  I connected with  Kiara Greer on 03/29/20 at  9:00 AM EST by a video enabled telemedicine application and verified that I am speaking with the correct person using two identifiers. Patient/Parent Location: at home   I discussed the limitations, risks, security and privacy concerns of performing an evaluation and management service by telephone and the availability of in person appointments. I also discussed with the parents that there may be a patient responsible charge related to this service. The parents expressed understanding and agreed to proceed.  Provider: Carron Curie, NP  Location: private work location  HISTORY/CURRENT STATUS: Kiara Greer is here for medication management of the psychoactive medications for ADHD and review of educational and behavioral concerns.   Dallie currently taking Evekeo 10 mg daily, which is working well. Takes medication as needed during the day. Medication tends to wear off around evening. Kiara Greer is able to focus through school/homework.   Zephyr is eating well (eating breakfast, lunch and dinner). No issues reported.   Sleeping well (getting plenty of sleep), sleeping through the night.   EDUCATION: School: [employer] Year/Grade: college  Performance/ Grades: above average Services: Other: tutoring available on campus  Activities/ Exercise: intermittently  Screen time: (phone, tablet, TV, computer): Computer for learning, phone, TV and movies.   MEDICAL HISTORY: Individual Medical History/ Review of Systems: Changes? :Yes, endocrinology last week with unsure  of changes.   Family Medical/ Social History: Changes? None reported.  Patient Lives with: parents and sister when home.   Current Medications:  Current Outpatient Medications  Medication Instructions   acetone, urine, test strip Check ketones per protocol   albuterol (VENTOLIN HFA) 108 (90 Base) MCG/ACT inhaler 1-2 inhalations every 4-6 hours as needed for wheezing. Dispense spacer as needed.   Amphetamine Sulfate (EVEKEO) 10-20 mg, Oral, 2 times daily   Calcium-Vitamin D-Vitamin K 500-100-40 MG-UNT-MCG CHEW Oral,     cetirizine (ZYRTEC) 10 mg, Oral, As needed   Continuous Blood Gluc Sensor (DEXCOM G6 SENSOR) MISC 1 application, Subcutaneous, As needed   Continuous Blood Gluc Transmit (DEXCOM G6 TRANSMITTER) MISC CHANGE EVERY 3 MONTHS   fluticasone (FLONASE) 50 MCG/ACT nasal spray 2 sprays in each nostril daily x1 week then 1-2 sprays in each nostril daily. (use smallest dose possible for symptom control after week 1).   Glucagon (BAQSIMI TWO PACK) 3 MG/DOSE POWD 1 application, Nasal, As needed, Use as directed if unconscious, unable to take food po, or having a seizure due to hypoglycemia   insulin aspart (NOVOLOG FLEXPEN) 100 UNIT/ML FlexPen Inject as directed by MD, total daily dose up to 50 units. In case of pump failure.   insulin aspart (NOVOLOG) 100 UNIT/ML injection 300 units in  Insulin Pump every 48 hours   Multiple Vitamin (MULTIVITAMIN) tablet 1 tablet, Daily   norethindrone-ethinyl estradiol (JUNEL FE 1/20) 1-20 MG-MCG tablet 1 tablet, Oral, Daily   olopatadine (PATANOL) 0.1 % ophthalmic solution No dose, route, or frequency recorded.   PROAIR HFA 108 (90 Base) MCG/ACT inhaler 1-2 INHALATIONS EVERY 4-6 HOURS AS NEEDED FOR WHEEZING. DISPENSE SPACER AS NEEDED.   rosuvastatin (CRESTOR) 10 mg, Oral,  Daily   sertraline (ZOLOFT) 100 mg, Oral, Daily   Synthroid 137 mcg, Oral, Daily, Brand name medically necessary   Medication Side Effects: None  MENTAL  HEALTH: Mental Health Issues:   Anxiety-symptoms well controlled with Zoloft   DIAGNOSES:    ICD-10-CM   1. ADHD (attention deficit hyperactivity disorder), inattentive type  F90.0   2. Learning difficulty  F81.9   3. Dysgraphia  R27.8   4. Celiac disease  K90.0   5. Acquired autoimmune hypothyroidism  E06.3   6. Diabetic ketoacidosis without coma associated with type 1 diabetes mellitus (HCC)  E10.10   7. Goiter  E04.9   8. Hashimoto's thyroiditis  E06.3   9. Hypoglycemia associated with diabetes (HCC)  E11.649   10. Hyperlipidemia type II  E78.01   11. Hypercholesterolemia  E78.00     RECOMMENDATIONS:  Discussed recent history with patient with updates for school, classes, courses, health and medication management.   Discussed school academic progress and recommended continued accommodations for the school year with updates for learning needs.   Discussed growth and development and current weight. Recommended healthy food choices, watching portion sizes, avoiding second helpings, avoiding sugary drinks like soda and tea, drinking more water, getting more exercise.   Discussed continued need for structure, routine, reward (external), motivation (internal), positive reinforcement, consequences, and organization with school, home and social settings.   Encouraged recommended limitations on TV, tablets, phones, video games and computers for non-educational activities.   Discussed need for bedtime routine, use of good sleep hygiene, no video games, TV or phones for an hour before bedtime.   Encouraged physical activity and outdoor play, maintaining social distancing.   Counseled medication pharmacokinetics, options, dosage, administration, desired effects, and possible side effects.   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    I discussed the assessment and treatment plan  with the patient. The patient was provided an opportunity to ask questions and all were answered. The patient agreed with the plan and demonstrated an understanding of the instructions.   I provided 25 minutes of non-face-to-face time during this encounter.  Completed record review for 10 minutes prior to the virtual video visit.   NEXT APPOINTMENT:  Return in about 3 months (around 06/27/2020) for f/u visit.  The patient/parent was advised to call back or seek an in-person evaluation if the symptoms worsen or if the condition fails to improve as anticipated.  Medical Decision-making: More than 50% of the appointment was spent counseling and discussing diagnosis and management of symptoms with the patient and family.  Carron Curie, NP

## 2020-03-31 ENCOUNTER — Other Ambulatory Visit: Payer: Self-pay | Admitting: Family

## 2020-03-31 ENCOUNTER — Other Ambulatory Visit: Payer: Self-pay

## 2020-03-31 ENCOUNTER — Other Ambulatory Visit (INDEPENDENT_AMBULATORY_CARE_PROVIDER_SITE_OTHER): Payer: Self-pay | Admitting: Pediatrics

## 2020-03-31 DIAGNOSIS — E1069 Type 1 diabetes mellitus with other specified complication: Secondary | ICD-10-CM

## 2020-03-31 DIAGNOSIS — F900 Attention-deficit hyperactivity disorder, predominantly inattentive type: Secondary | ICD-10-CM

## 2020-03-31 DIAGNOSIS — F411 Generalized anxiety disorder: Secondary | ICD-10-CM

## 2020-03-31 MED ORDER — SERTRALINE HCL 100 MG PO TABS: 100.0000 mg | ORAL_TABLET | Freq: Two times a day (BID) | ORAL | 0 refills | Status: DC

## 2020-03-31 MED ORDER — AMPHETAMINE SULFATE 10 MG PO TABS: 10.0000 mg | ORAL_TABLET | Freq: Two times a day (BID) | ORAL | 0 refills | Status: DC

## 2020-03-31 MED FILL — AMPHETAMINE SULFATE 10 MG T: 10 | 60 days supply | Qty: 240 | Fill #0

## 2020-03-31 MED FILL — SERTRALINE HCL 100 MG TABS: 100 | 90 days supply | Qty: 180 | Fill #0

## 2020-03-31 NOTE — Telephone Encounter (Signed)
Zoloft 100 mg BID, # 180 with no RF's and Evekeo 10 mg 2 BID, # 240 with no RF's.RX for above e-scribed and sent to pharmacy on record  [facility] [address] [place] [address] Phone: [phone] Fax: [place], [place] [address] [place] [address] Phone: [phone] Fax: [phone]

## 2020-04-01 ENCOUNTER — Encounter: Payer: Self-pay | Admitting: Family

## 2020-04-04 DIAGNOSIS — Z20822 Contact with and (suspected) exposure to covid-19: Secondary | ICD-10-CM | POA: Diagnosis not present

## 2020-04-05 ENCOUNTER — Telehealth: Payer: Self-pay | Admitting: Family

## 2020-04-05 NOTE — Telephone Encounter (Signed)
  Dee sent letter to [employer] parents.  Bobi had to sign in Dawn's absence.

## 2020-06-11 ENCOUNTER — Telehealth (INDEPENDENT_AMBULATORY_CARE_PROVIDER_SITE_OTHER): Payer: Self-pay | Admitting: Pediatrics

## 2020-06-11 DIAGNOSIS — E1069 Type 1 diabetes mellitus with other specified complication: Secondary | ICD-10-CM

## 2020-06-11 MED ORDER — DEXCOM G6 TRANSMITTER MISC: 1 refills | Status: DC

## 2020-06-11 NOTE — Telephone Encounter (Signed)
Spoke with mom and let her know the sensors were sent to the requested pharmacy. Mom asked that the Transmitters be sent as well.

## 2020-06-11 NOTE — Telephone Encounter (Signed)
Patient is out of the country [place]) doing a semester abroad. Follow up has been scheduled for 09/02/20. Mom requests that we send new RX for Dexcom sensors to [facility], who told them they needed a new RX for sensors.

## 2020-06-17 ENCOUNTER — Telehealth (INDEPENDENT_AMBULATORY_CARE_PROVIDER_SITE_OTHER): Payer: Self-pay | Admitting: Pediatrics

## 2020-06-17 NOTE — Telephone Encounter (Signed)
Paperwork was received in email and forwarded to Dr. Charna Archer and Providence Lanius

## 2020-06-17 NOTE — Telephone Encounter (Signed)
  Who's calling (name and relationship to patient) : [name] ( self)  Best contact number:[phone]  Provider they see: Dr. Charna Archer  Reason for call: Patient called needing some paperwork completed but is [place]. I gave her the [facility] email and asked her to put ATTN DR>JESSUP she asked if we could email back to her when completed. [email or web address] patient was seen 12-21 and has an upcoming appt for [place]  Name of prescription:  Pharmacy:

## 2020-06-22 NOTE — Telephone Encounter (Signed)
Paperwork completed and emailed to patient 06/18/20.    Levon Hedger, MD

## 2020-09-01 NOTE — Patient Instructions (Signed)
It was a pleasure to see you in clinic today.   Feel free to contact our office during normal business hours at [phone] with questions or concerns. If you need us urgently after normal business hours, please call the above number to reach our answering service who will contact the on-call pediatric endocrinologist.  If you choose to communicate with us via MyChart, please do not send urgent messages as this inbox is NOT monitored on nights or weekends.  Urgent concerns should be discussed with the on-call pediatric endocrinologist.  -Always have fast sugar with you in case of low blood sugar (glucose tabs, regular juice or soda, candy) -Always wear your ID that states you have diabetes -Always bring your meter/continuous glucose monitor to your visit -Call/Email if you want to review blood sugars   At [facility], we are committed to providing exceptional care. You will receive a patient satisfaction survey through text or email regarding your visit today. Your opinion is important to me. Comments are appreciated.  

## 2020-09-01 NOTE — Progress Notes (Signed)
Pediatric Endocrinology Diabetes Consultation Follow-up Visit  Kiara Greer 05/18/2000 [id number]  Chief Complaint: Follow-up type 1 diabetes, acquired hypothyroidism, celiac disease, abnormal lipids, menorrhagia  Kandace Blitz, MD   HPI: Kiara Greer is a 20 y.o. female presenting for follow-up of the above concerns.  she is accompanied to this visit alone.  70. Allona was diagnosed with type 1 diabetes mellitus at age 19 months in 2002; she was not in DKA at diagnosis.  She was initially followed at [facility] pediatric endocrinology. She was started on an insulin pump one week after diagnosis.  She most recently upgraded to a medtronic 530G pump in Summer 2016.  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 Celiac disease was diagnosed in 2008. She was referred to our clinic on 01/14/09 at age [age] years.  Her mother and younger sister, [name] also had T1DM and celiac disease. Mother was also hypothyroid then. Sister [name] was initially euthyroid, but developed hypothyroidism in 2012.She started using a dexcom CGM in 04/2016. She transitioned to a Tslim pump in 11/2018.  2. Since last visit on 03/23/20, she has been well.   ED visits/Hospitalizations: No   Concerns:  -Wearing dexcom more consistently. Sleep mode constantly.  Not bolusing much.  Insulin regimen: Tslim settings      CGM interpretation: Rising overnight, takes a while for blood sugar to come down (as she is in sleep mode and not getting auto-boluses).  Would benefit from not using sleep mode.  Hypoglycemia: Not many.  Able to feel low blood sugars.  No glucagon needed recently.  Wearing Med-alert ID currently: Not wearing today (loaned to sister).  Encouraged to wear one  Injection sites: CGM on arms, pump site on back/butt Annual labs due: 03/2021 Ophthalmology due: 09/2020 (Performed 09/2018 with Dr. Prudencio Burly, no retinopathy)  Acquired hypothyroidism: She takes synthroid  161mg daily (increased 10/2017).   Missing doses: has been missing recently, has been out of routine.  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 Energy level: has been more tired (may be due to boredom) Sleep: not great Periods regular: yes, on OCPs below (sometimes forgets)  Celiac disease: Continues on a gluten free diet majority of the time  Weight changes: Weight has increased 4lb since last vist.      Hyperlipidemia: She continues on crestor 117mdaily (increased in 10/2018) and a plant based sterol called cholest-off.  Forgets often Muscle aches: None  Menorrhagia with irregular cycles:  Started on OCPs in 12/2016 for menorrhagia with irregular cycles with improvement in menses though these were stopped in 03/2017 due to concerns they were contributing to depression.  Menses resumed very heavy so she was restarted on OCPs in 07/2017 without change in depression.   Continues on Junel Fe 1/20 Withdrawal bleeding during week of inactive pills: yes Spotting: no   ROS: All systems reviewed with pertinent positives listed below; otherwise negative.  Past Medical History:   Past Medical History:  Diagnosis Date  . ADHD (attention deficit hyperactivity disorder)   . Celiac disease   . Diabetes mellitus type I ([place]  . Duanes syndrome   . Goiter   . Hashimoto's thyroiditis   . Hyperlipidemia type II   . Hypoglycemia associated with diabetes ([facility]  . Hypoglycemia unawareness in type 1 diabetes mellitus ([facility]  . Hypothyroidism, acquired, autoimmune     Medications:  Outpatient Encounter Medications as of 09/02/2020  Medication Sig Note  . Amphetamine Sulfate 10 MG  TABS TAKE 1 TO 2 TABLETS BY MOUTH 2 TIMES DAILY   . Calcium-Vitamin D-Vitamin K 578-469-62 MG-UNT-MCG CHEW Chew by mouth.   . cetirizine (ZYRTEC) 10 MG tablet Take 10 mg by mouth as needed for allergies.   . Continuous Blood Gluc Sensor (DEXCOM G6 SENSOR) MISC Change sensor every 10 days   .  Continuous Blood Gluc Transmit (DEXCOM G6 TRANSMITTER) MISC CHANGE EVERY 3 MONTHS   . insulin aspart (NOVOLOG) 100 UNIT/ML injection 300 units in  Insulin Pump every 48 hours   . Multiple Vitamin (MULTIVITAMIN) tablet Take 1 tablet by mouth daily.     . norethindrone-ethinyl estradiol (JUNEL FE 1/20) 1-20 MG-MCG tablet Take 1 tablet by mouth daily.   . rosuvastatin (CRESTOR) 10 MG tablet TAKE 1 TABLET BY MOUTH DAILY . GENERIC EQUIVALENT FOR CRESTOR.   Marland Kitchen sertraline (ZOLOFT) 100 MG tablet TAKE 1 TABLET BY MOUTH 2 TIMES DAILY   . SYNTHROID 137 MCG tablet Take 1 tablet (137 mcg total) by mouth daily. Brand name medically necessary   . acetone, urine, test strip Check ketones per protocol (Patient not taking: Reported on 09/02/2020)   . albuterol (VENTOLIN HFA) 108 (90 Base) MCG/ACT inhaler 1-2 inhalations every 4-6 hours as needed for wheezing. Dispense spacer as needed. 10/10/2018: Keeps for PRN use  . fluticasone (FLONASE) 50 MCG/ACT nasal spray 2 sprays in each nostril daily x1 week then 1-2 sprays in each nostril daily. (use smallest dose possible for symptom control after week 1).   . Glucagon (BAQSIMI TWO PACK) 3 MG/DOSE POWD Place 1 application into the nose as needed. Use as directed if unconscious, unable to take food po, or having a seizure due to hypoglycemia (Patient not taking: Reported on 09/02/2020)   . insulin aspart (NOVOLOG FLEXPEN) 100 UNIT/ML FlexPen Inject as directed by MD, total daily dose up to 50 units. In case of pump failure. (Patient not taking: Reported on 09/02/2020)   . PROAIR HFA 108 (90 Base) MCG/ACT inhaler 1-2 INHALATIONS EVERY 4-6 HOURS AS NEEDED FOR WHEEZING. DISPENSE SPACER AS NEEDED. (Patient not taking: Reported on 09/02/2020) 10/10/2018: Keeps for PRN use  . [DISCONTINUED] olopatadine (PATANOL) 0.1 % ophthalmic solution  (Patient not taking: Reported on 09/02/2020)    No facility-administered encounter medications on file as of 09/02/2020.  Taking an OTC tablet called cholest-off  for hyperlipidemia (contains plant sterols)  Allergies: No Known Allergies  Surgical History: Past Surgical History:  Procedure Laterality Date  . NO PAST SURGERIES      Family History:  Family History  Problem Relation Age of Onset  . Diabetes Mother   . Thyroid disease Mother   . Celiac disease Mother   . Diabetes Sister   . Thyroid disease Sister   . Celiac disease Sister   . Diabetes Maternal Uncle   . Thyroid disease Maternal Grandmother   . Celiac disease Maternal Grandmother   . Cancer Neg Hx     Social History: Attends [employer], studied abroad in [place] spring 2022.  Doing virtual internship currently.  Will be a senior at [employer] in the Fall  Physical Exam:  Vitals:   09/02/20 0920  BP: 110/70  Pulse: 80  Weight: 156 lb (70.8 kg)  Height: 5' 5.24" (1.657 m)   BP 110/70   Pulse 80   Ht 5' 5.24" (1.657 m)   Wt 156 lb (70.8 kg)   LMP 08/30/2020 (Exact Date)   BMI 25.77 kg/m  Body mass index: body mass index is 25.77 kg/m. Growth  percentile SmartLinks can only be used for patients less than 46 years old.  Ht Readings from Last 3 Encounters:  09/02/20 5' 5.24" (1.657 m)  07/10/19 5' 5"  (1.651 m)  01/31/18 5' 4.57" (1.64 m) (55 %, Z= 0.12)*   * Growth percentiles are based on CDC (Girls, 2-20 Years) data.   Wt Readings from Last 3 Encounters:  09/02/20 156 lb (70.8 kg)  03/23/20 152 lb 9.6 oz (69.2 kg)  01/31/18 136 lb (61.7 kg) (68 %, Z= 0.46)*   * Growth percentiles are based on CDC (Girls, 2-20 Years) data.   General: Well developed, well nourished female in no acute distress.  Appears stated age Head: Normocephalic, atraumatic.   Eyes:  Pupils equal and round. Sclera white.  No eye drainage.   Ears/Nose/Mouth/Throat: Masked Neck: supple, no cervical lymphadenopathy, no thyromegaly Cardiovascular: regular rate, normal S1/S2, no murmurs Respiratory: No increased work of breathing.  Lungs clear to auscultation bilaterally.  No wheezes. Abdomen: soft,  nontender, nondistended.  Extremities: warm, well perfused, cap refill < 2 sec.   Musculoskeletal: Normal muscle mass.  Normal strength Skin: warm, dry.  No rash or lesions. CGM on L arm Neurologic: alert and oriented, normal speech, no tremor   Labs: Hemoglobin A1c trend: 9.3%-->10.7%-->10.2% -->9.3%-->8.6%-->8.3% 12/2016-->11.2% 04/2017-->9.5% 07/2017-->8.9% 10/2017-->11% 01/2018, 11.4% 10/2018, 11.1% 03/2020, 8.2% 09/2020  Results for orders placed or performed in visit on 09/02/20  POCT Glucose (Device for Home Use)  Result Value Ref Range   Glucose Fasting, POC     POC Glucose 241 (A) 70 - 99 mg/dl  POCT glycosylated hemoglobin (Hb A1C)  Result Value Ref Range   Hemoglobin A1C 8.2 (A) 4.0 - 5.6 %   HbA1c POC (<> result, manual entry)     HbA1c, POC (prediabetic range)     HbA1c, POC (controlled diabetic range)      Assessment/Plan: Francille Wittmann is a 20 y.o. female with T1DM on a pump (Tslim control IQ) and CGM regimen.   A1c is much improved from last visit and is above the ADA goal of <7.0%.  she would benefit from turning sleep mode off on pump to allow autoboluses.    She continues on synthroid for hypothyroidism though she has not been taking it consistently. She continues to follow a gluten-free diet most of the time and has no GI symptoms.  She also continues on rosuvastatin for hyperlipidemia (taking intermittently).  She is doing well on low dose OCP.  When a patient is on insulin, intensive monitoring of blood glucose levels and continuous insulin titration is vital to avoid insulin toxicity leading to severe hypoglycemia. Severe hypoglycemia can lead to seizure or death. Hyperglycemia can also result from inadequate insulin dosing and can lead to ketosis requiring ICU admission and intravenous insulin.   1. Type 1 without complications (HCC) - POCT Glucose and POCT HgB A1C as above.  Commended on improvement in A1c -Encouraged to wear med alert ID every day -Encouraged to  rotate injection sites -Provided with my contact information and advised to email/send mychart with questions/need for BG review -CGM download reviewed extensively (see interpretation above) -Rx sent to pharmacy include: insulin, dexcom sensors/transmitters, crestor, synthroid  2. Insulin pump titration -Made the following pump changes: Turn off sleep mode to allow autoboluses  3. Acquired autoimmune hypothyroidism -Continue current levothyroxine. Try moving all meds to evening to improve compliance  4. Celiac disease -Continue gluten free diet  5. Hyperlipidemia due to type 1 diabetes mellitus (HCC) -Continue rosuvastatin  6. Menorrhagia with irregular cycle -Continue current OCP  Follow-up:   Return in about 3 months (around 12/03/2020).   >40 minutes spent today reviewing the medical chart, counseling the patient/family, and documenting today's encounter.   Levon Hedger, MD

## 2020-09-02 ENCOUNTER — Encounter (INDEPENDENT_AMBULATORY_CARE_PROVIDER_SITE_OTHER): Payer: Self-pay | Admitting: Pediatrics

## 2020-09-02 ENCOUNTER — Other Ambulatory Visit: Payer: Self-pay

## 2020-09-02 ENCOUNTER — Ambulatory Visit (INDEPENDENT_AMBULATORY_CARE_PROVIDER_SITE_OTHER): Payer: BC Managed Care – PPO | Admitting: Pediatrics

## 2020-09-02 VITALS — BP 110/70 | HR 80 | Ht 65.24 in | Wt 156.0 lb

## 2020-09-02 DIAGNOSIS — E1069 Type 1 diabetes mellitus with other specified complication: Secondary | ICD-10-CM | POA: Diagnosis not present

## 2020-09-02 DIAGNOSIS — E785 Hyperlipidemia, unspecified: Secondary | ICD-10-CM

## 2020-09-02 DIAGNOSIS — Z4681 Encounter for fitting and adjustment of insulin pump: Secondary | ICD-10-CM | POA: Diagnosis not present

## 2020-09-02 DIAGNOSIS — E063 Autoimmune thyroiditis: Secondary | ICD-10-CM | POA: Diagnosis not present

## 2020-09-02 DIAGNOSIS — K900 Celiac disease: Secondary | ICD-10-CM | POA: Diagnosis not present

## 2020-09-02 DIAGNOSIS — N921 Excessive and frequent menstruation with irregular cycle: Secondary | ICD-10-CM

## 2020-09-02 LAB — POCT GLYCOSYLATED HEMOGLOBIN (HGB A1C): Hemoglobin A1C: 8.2 % — AB (ref 4.0–5.6)

## 2020-09-02 LAB — POCT GLUCOSE (DEVICE FOR HOME USE): POC Glucose: 241 mg/dl — AB (ref 70–99)

## 2020-09-02 MED ORDER — ROSUVASTATIN CALCIUM 10 MG PO TABS: ORAL_TABLET | ORAL | 3 refills | Status: DC

## 2020-09-02 MED ORDER — NORETHIN ACE-ETH ESTRAD-FE 1-20 MG-MCG PO TABS: 1.0000 | ORAL_TABLET | Freq: Every day | ORAL | 3 refills | Status: DC

## 2020-09-02 MED ORDER — DEXCOM G6 SENSOR MISC: 3 refills | Status: DC

## 2020-09-02 MED ORDER — DEXCOM G6 TRANSMITTER MISC: 3 refills | Status: DC

## 2020-09-02 MED ORDER — SYNTHROID 137 MCG PO TABS: 137.0000 ug | ORAL_TABLET | Freq: Every day | ORAL | 3 refills | Status: DC

## 2020-09-02 MED ORDER — INSULIN ASPART 100 UNIT/ML IJ SOLN: INTRAMUSCULAR | 3 refills | Status: DC

## 2020-10-29 ENCOUNTER — Telehealth (INDEPENDENT_AMBULATORY_CARE_PROVIDER_SITE_OTHER): Payer: BC Managed Care – PPO | Admitting: Family

## 2020-10-29 ENCOUNTER — Other Ambulatory Visit: Payer: Self-pay

## 2020-10-29 ENCOUNTER — Other Ambulatory Visit (HOSPITAL_COMMUNITY): Payer: Self-pay

## 2020-10-29 ENCOUNTER — Encounter: Payer: Self-pay | Admitting: Family

## 2020-10-29 DIAGNOSIS — K900 Celiac disease: Secondary | ICD-10-CM

## 2020-10-29 DIAGNOSIS — F411 Generalized anxiety disorder: Secondary | ICD-10-CM | POA: Diagnosis not present

## 2020-10-29 DIAGNOSIS — F819 Developmental disorder of scholastic skills, unspecified: Secondary | ICD-10-CM

## 2020-10-29 DIAGNOSIS — E1010 Type 1 diabetes mellitus with ketoacidosis without coma: Secondary | ICD-10-CM

## 2020-10-29 DIAGNOSIS — Z7189 Other specified counseling: Secondary | ICD-10-CM

## 2020-10-29 DIAGNOSIS — E063 Autoimmune thyroiditis: Secondary | ICD-10-CM

## 2020-10-29 DIAGNOSIS — Z719 Counseling, unspecified: Secondary | ICD-10-CM

## 2020-10-29 DIAGNOSIS — H5089 Other specified strabismus: Secondary | ICD-10-CM | POA: Diagnosis not present

## 2020-10-29 DIAGNOSIS — E7801 Familial hypercholesterolemia: Secondary | ICD-10-CM | POA: Diagnosis not present

## 2020-10-29 DIAGNOSIS — Z79899 Other long term (current) drug therapy: Secondary | ICD-10-CM

## 2020-10-29 DIAGNOSIS — F900 Attention-deficit hyperactivity disorder, predominantly inattentive type: Secondary | ICD-10-CM

## 2020-10-29 DIAGNOSIS — R278 Other lack of coordination: Secondary | ICD-10-CM

## 2020-10-29 MED ORDER — AMPHETAMINE SULFATE 10 MG PO TABS
10.0000 mg | ORAL_TABLET | Freq: Every day | ORAL | 0 refills | Status: DC
  Filled 2020-10-29: qty 60, 30d supply, fill #0

## 2020-10-29 MED ORDER — SERTRALINE HCL 100 MG PO TABS
ORAL_TABLET | Freq: Two times a day (BID) | ORAL | 0 refills | Status: DC
  Filled 2020-10-29 – 2021-03-04 (×2): qty 180, 90d supply, fill #0

## 2020-10-29 NOTE — Progress Notes (Signed)
[facility]. [address] [place] [address] Dept: [phone] Dept Fax: [phone]  Medication Check visit via Virtual Video   Patient ID:  Kiara Greer  female DOB: March 12, 2001   20 y.o.   MRN: [id number]   DATE:10/29/20  PCP: Kandace Blitz, MD  Virtual Visit via Video Note  I connected with  [name] on 10/29/20 at 10:00 AM EDT by a video enabled telemedicine application and verified that I am speaking with the correct person using two identifiers. Patient/Parent Location: at home   I discussed the limitations, risks, security and privacy concerns of performing an evaluation and management service by telephone and the availability of in person appointments. I also discussed with the parents that there may be a patient responsible charge related to this service. The parents expressed understanding and agreed to proceed.  Provider: Carolann Littler, NP  Location: private work location  HPI/CURRENT STATUS: Kiara Greer is here for medication management of the psychoactive medications for ADHD and review of educational and behavioral concerns.   Katricia currently taking Evekeo for school days and Zoloft most days, which is working well. Takes medication in the morning each day. Medication tends to wear off around evening time with her Evekeo. Lafreda is able to focus through school/homework.   Damita is eating well (eating breakfast, lunch and dinner). Eating with no changes.   Sleeping well (goes to bed at 10-11:00 pm wakes at 7:00 am), sleeping through the night.   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   MEDICAL HISTORY: Individual Medical History/ Review of Systems: No health concerns Endocrinology recently for routine care. No changes in medication and f/u  every 3 months.   Family Medical/ Social History: Changes? None  Patient Lives with: parents when at home and will move into campus apartments.   MENTAL HEALTH: Mental Health Issues:   Depression and Anxiety-Zoloft with symptom control at the current dose.   Allergies: No Known Allergies  Current Medications:  Current Outpatient Medications  Medication Instructions   acetone, urine, test strip Check ketones per protocol   albuterol (VENTOLIN HFA) 108 (90 Base) MCG/ACT inhaler 1-2 inhalations every 4-6 hours as needed for wheezing. Dispense spacer as needed.   Amphetamine Sulfate (EVEKEO) 10 MG TABS Take 1 to 2 tablets by mouth daily.   Calcium-Vitamin D-Vitamin K 500-100-40 MG-UNT-MCG CHEW Oral,     cetirizine (ZYRTEC) 10 mg, Oral, As needed   Continuous Blood Gluc Sensor (DEXCOM G6 SENSOR) MISC Change sensor every 10 days   Continuous Blood Gluc Transmit (DEXCOM G6 TRANSMITTER) MISC CHANGE EVERY 3 MONTHS   fluticasone (FLONASE) 50 MCG/ACT nasal spray 2 sprays in each nostril daily x1 week then 1-2 sprays in each nostril daily. (use smallest dose possible for symptom control after week 1).   Glucagon (BAQSIMI TWO PACK) 3 MG/DOSE POWD 1 application, Nasal, As needed, Use as directed if unconscious, unable to take food po, or having a seizure due to hypoglycemia   insulin aspart (NOVOLOG FLEXPEN) 100 UNIT/ML FlexPen Inject as directed by MD, total daily dose up to 50 units. In case of pump failure.   insulin aspart (NOVOLOG) 100 UNIT/ML injection 300 units in  Insulin Pump every 48 hours   insulin aspart (NOVOLOG) 100 UNIT/ML injection Inject 300 units in insulin pump every 48-72 hours   Multiple Vitamin (MULTIVITAMIN) tablet 1 tablet, Daily   norethindrone-ethinyl  estradiol-FE (JUNEL FE 1/20) 1-20 MG-MCG tablet 1 tablet, Oral, Daily   PROAIR HFA 108 (90 Base) MCG/ACT inhaler 1-2 INHALATIONS EVERY 4-6 HOURS AS NEEDED FOR WHEEZING. DISPENSE SPACER AS NEEDED.   rosuvastatin (CRESTOR) 10 MG tablet TAKE 1 TABLET BY MOUTH DAILY   sertraline (ZOLOFT) 100 MG tablet TAKE 1 TABLET BY MOUTH 2 TIMES DAILY   Synthroid 137 mcg, Oral, Daily, Brand name medically necessary   Medication Side Effects: None  DIAGNOSES:    ICD-10-CM   1. ADHD (attention deficit hyperactivity disorder), inattentive type  F90.0     2. Generalized anxiety disorder  F41.1 sertraline (ZOLOFT) 100 MG tablet    3. Hyperlipidemia type II  E78.01     4. Duane's syndrome, unspecified laterality  H50.89     5. Celiac disease  K90.0     6. 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 Diabetic ketoacidosis without coma associated with type 1 diabetes mellitus (HCC)  E10.10     8. Hashimoto's thyroiditis  E06.3     9. Problems with learning  F81.9     10. Dysgraphia  R27.8     11. Medication management  Z79.899     12. Patient counseled  Z71.9     13. Goals of care, counseling/discussion  Z71.89      ASSESSMENT: [name] is a 20 year old female with a history of ADHD, learning, and Anxiety with good symptom control when taking her Zoloft and Evekeo on a regular basis. Taking medication not as regularly the past several months since she has been back from [place]. Now attempting to get back on a regular schedule for sleeping, eating and medication. NO changes reported since last f/u visit with health or f/u appointments with specialists. No current concerns with medications or dosing with regimen. To f/u in 3 months for routine visit.   PLAN/RECOMMENDATIONS:  Patient provided updates for school, classes for the fall, senior year, gradation in [place] and start of job searching.  Patient with academic success and progress over her study abroad semester with positive success.   Services  in place for the upcoming semester to assist with learning and anxiety. No changes to accommodation and if needed to contact disability services.   Recent change in activity level from remote internship from walking mostly in [place]. Encouraged physical activity when possible with suggestion provided.  Discussed dietary intake with no recent changes. Attempting to be more mindful of gluten free diet with small changes. More difficult when not eating at home and unaware of ingredients. Discussed small changes and eating more fruits and vegetables to help with less intake of carbohydrates through pasta, breads or other high gluten foods.   Due to recent change in schedule and structure from study abroad it has been more difficult to adjust back to her old schedule. Discussed consistency with alarms to help along with start of the semester for a more of a daily routine. This will help with medication adherence.  . Encouraged good sleep hygiene with bedtime habitls. Scheduling sleep and waking  times each day about the same time. Decreasing stimulus before bed and turning off all screening an hour before bedtime.    Counseled medication pharmacokinetics, options, dosage, administration, desired effects, and possible side effects.   Zoloft  100 mg daily, # 180 with no RF's Evekeo 10 mg 1-2 daily, # 60 with no RF's RX for above e-scribed and sent to pharmacy on record  [place] [id number] Phone: [phone] Fax: [phone]  I discussed the assessment and treatment plan with the patient. The patient/ was provided an opportunity to ask questions and all were answered. The patient agreed with the plan and demonstrated an understanding of the instructions.   I provided 33 minutes of non-face-to-face time during this encounter. Completed record review for 10 minutes prior to the virtual video visit.   NEXT APPOINTMENT:  01/11/2021  Return in about  3 months (around 01/29/2021) for f/u visit.  The patient was advised to call back or seek an in-person evaluation if the symptoms worsen or if the condition fails to improve as anticipated.   Carolann Littler, NP

## 2020-11-06 ENCOUNTER — Other Ambulatory Visit (HOSPITAL_COMMUNITY): Payer: Self-pay

## 2020-11-12 DIAGNOSIS — Z0000 Encounter for general adult medical examination without abnormal findings: Secondary | ICD-10-CM | POA: Diagnosis not present

## 2020-11-12 DIAGNOSIS — Z23 Encounter for immunization: Secondary | ICD-10-CM | POA: Diagnosis not present

## 2020-12-07 ENCOUNTER — Ambulatory Visit (INDEPENDENT_AMBULATORY_CARE_PROVIDER_SITE_OTHER): Payer: BC Managed Care – PPO | Admitting: Pediatrics

## 2020-12-07 NOTE — Progress Notes (Deleted)
Pediatric Endocrinology Diabetes Consultation Follow-up Visit  Kiara Greer 05-30-2000 [id number]  Chief Complaint: Follow-up type 1 diabetes, acquired hypothyroidism, celiac disease, abnormal lipids, menorrhagia  Kiara Blitz, MD   HPI: Kiara Greer is a 20 y.o. female presenting for follow-up of the above concerns.  she is accompanied to this visit ***alone.  12. Kiara Greer was diagnosed with type 1 diabetes mellitus at age 61 months in 2002; she was not in DKA at diagnosis.  She was initially followed at [facility] pediatric [facility]ocrinology. She was started on an insulin pump one week after diagnosis.  She most recently upgraded to a medtronic 530G pump in Summer 2016.  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 Celiac disease was diagnosed in 2008. She was referred to our clinic on 01/14/09 at age [age] years.  Her mother and younger sister, Kiara Greer also had T1DM and celiac disease. Mother was also hypothyroid then. Sister Kiara Greer was initially euthyroid, but developed hypothyroidism in 2012.  She started using a dexcom CGM in 04/2016. She transitioned to a Tslim pump in 11/2018.  2. Since last visit on 09/02/20, she has been ***well.   ED visits/Hospitalizations: No ***  Concerns:  -***  Insulin regimen:*** Tslim settings      CGM interpretation: ***  Hypoglycemia: ***Not many.  Able to feel low blood sugars.  No glucagon needed recently.  Wearing Med-alert ID currently: ***.  Encouraged to wear one  Injection sites: CGM on arms, pump site on back/butt*** Annual labs due: 03/2021*** Ophthalmology due: 09/2020 (Performed 09/2018 with Dr. Prudencio Burly, no retinopathy)***  Acquired hypothyroidism: She takes synthroid 150mg daily (increased 10/2017).   Missing doses: *** Energy level: *** Sleep: not great*** Periods regular: ***yes, on OCPs below   Celiac disease: Continues on a gluten free diet majority of the time  Weight changes: Weight has  ***creased ***lb since last visit.       Hyperlipidemia: She continues on crestor 155mdaily (increased in 10/2018) and a plant based sterol called cholest-off.  Muscle aches: None***  Menorrhagia with irregular cycles:  Started on OCPs in 12/2016 for menorrhagia with irregular cycles with improvement in menses though these were stopped in 03/2017 due to concerns they were contributing to depression.  Menses resumed very heavy so she was restarted on OCPs in 07/2017 without change in depression.   Continues on Junel Fe 1/20 Withdrawal bleeding during week of inactive pills: yes*** Spotting: no***   ROS: All systems reviewed with pertinent positives listed below; otherwise negative.   Past Medical History:   Past Medical History:  Diagnosis Date   ADHD (attention deficit hyperactivity disorder)    Celiac disease    Diabetes mellitus type I (HCCaswell   Duanes syndrome    Goiter    Hashimoto's thyroiditis    Hyperlipidemia type II    [place] with [place]   Hypoglycemia unawareness in type 1 diabetes mellitus (HCC)    Hypothyroidism, acquired, autoimmune     Medications:  Outpatient Encounter Medications as of 12/07/2020  Medication Sig Note   acetone, urine, test strip Check ketones per protocol (Patient not taking: No sig reported)    albuterol (VENTOLIN HFA) 108 (90 Base) MCG/ACT inhaler 1-2 inhalations every 4-6 hours as needed for wheezing. Dispense spacer as needed. 10/10/2018: Keeps for PRN use   Amphetamine Sulfate (EVEKEO) 10 MG TABS Take 1 to 2 tablets by mouth daily.    Calcium-Vitamin D-Vitamin K 50007-622-63G-UNT-MCG CHEW Chew by mouth.  cetirizine (ZYRTEC) 10 MG tablet Take 10 mg by mouth as needed for allergies.    Continuous Blood Gluc Sensor (DEXCOM G6 SENSOR) MISC Change sensor every 10 days    Continuous Blood Gluc Transmit (DEXCOM G6 TRANSMITTER) MISC CHANGE EVERY 3 MONTHS    fluticasone (FLONASE) 50 MCG/ACT nasal spray 2 sprays in each nostril  daily x1 week then 1-2 sprays in each nostril daily. (use smallest dose possible for symptom control after week 1).    Glucagon (BAQSIMI TWO PACK) 3 MG/DOSE POWD Place 1 application into the nose as needed. Use as directed if unconscious, unable to take food po, or having a seizure due to hypoglycemia    insulin aspart (NOVOLOG FLEXPEN) 100 UNIT/ML FlexPen Inject as directed by MD, total daily dose up to 50 units. In case of pump failure. (Patient not taking: No sig reported)    insulin aspart (NOVOLOG) 100 UNIT/ML injection 300 units in  Insulin Pump every 48 hours    insulin aspart (NOVOLOG) 100 UNIT/ML injection Inject 300 units in insulin pump every 48-72 hours    Multiple Vitamin (MULTIVITAMIN) tablet Take 1 tablet by mouth daily.      norethindrone-ethinyl estradiol-FE (JUNEL FE 1/20) 1-20 MG-MCG tablet Take 1 tablet by mouth daily.    PROAIR HFA 108 (90 Base) MCG/ACT inhaler 1-2 INHALATIONS EVERY 4-6 HOURS AS NEEDED FOR WHEEZING. DISPENSE SPACER AS NEEDED. (Patient not taking: No sig reported) 10/10/2018: Keeps for PRN use   rosuvastatin (CRESTOR) 10 MG tablet TAKE 1 TABLET BY MOUTH DAILY    sertraline (ZOLOFT) 100 MG tablet TAKE 1 TABLET BY MOUTH 2 TIMES DAILY    SYNTHROID 137 MCG tablet Take 1 tablet (137 mcg total) by mouth daily. Brand name medically necessary    No facility-administered encounter medications on file as of 12/07/2020.  Taking an OTC tablet called cholest-off for hyperlipidemia (contains plant sterols)  Allergies: No Known Allergies  Surgical History: Past Surgical History:  Procedure Laterality Date   NO PAST SURGERIES      Family History:  Family History  Problem Relation Age of Onset   Diabetes Mother    Thyroid disease Mother    Celiac disease Mother    Diabetes Sister    Thyroid disease Sister    Celiac disease Sister    Diabetes Maternal Uncle    Thyroid disease Maternal Grandmother    Celiac disease Maternal Grandmother    Cancer Neg Hx     Social  History: Attends [employer], studied abroad in [place] spring 2022.  Senior at [employer]   Physical Exam:  There were no vitals filed for this visit.  There were no vitals taken for this visit. Body mass index: body mass index is unknown because there is no height or weight on file. Growth percentile SmartLinks can only be used for patients less than 58 years old.  Ht Readings from Last 3 Encounters:  09/02/20 5' 5.24" (1.657 m)  07/10/19 5' 5"  (1.651 m)  01/31/18 5' 4.57" (1.64 m) (55 %, Z= 0.12)*   * Growth percentiles are based on CDC (Girls, 2-20 Years) data.   Wt Readings from Last 3 Encounters:  09/02/20 156 lb (70.8 kg)  03/23/20 152 lb 9.6 oz (69.2 kg)  01/31/18 136 lb (61.7 kg) (68 %, Z= 0.46)*   * Growth percentiles are based on CDC (Girls, 2-20 Years) data.   General: Well developed, well nourished ***female in no acute distress.  Appears *** stated age Head: Normocephalic, atraumatic.   Eyes:  Pupils equal and round. EOMI.   Sclera white.  No eye drainage.   Ears/Nose/Mouth/Throat: Masked Neck: supple, no cervical lymphadenopathy, no thyromegaly Cardiovascular: regular rate, normal S1/S2, no murmurs Respiratory: No increased work of breathing.  Lungs clear to auscultation bilaterally.  No wheezes. Abdomen: soft, nontender, nondistended.  Extremities: warm, well perfused, cap refill < 2 sec.   Musculoskeletal: Normal muscle mass.  Normal strength Skin: warm, dry.  No rash or lesions. Neurologic: alert and oriented, normal speech, no tremor   Labs: Hemoglobin A1c trend: 9.3%-->10.7%-->10.2% -->9.3%-->8.6%-->8.3% 12/2016-->11.2% 04/2017-->9.5% 07/2017-->8.9% 10/2017-->11% 01/2018, 11.4% 10/2018, 11.1% 03/2020, 8.2% 09/2020, ***% 12/2020  Results for orders placed or performed in visit on 09/02/20  POCT Glucose (Device for Home Use)  Result Value Ref Range   Glucose Fasting, POC     POC Glucose 241 (A) 70 - 99 mg/dl  POCT glycosylated hemoglobin (Hb A1C)  Result Value Ref Range    Hemoglobin A1C 8.2 (A) 4.0 - 5.6 %   HbA1c POC (<> result, manual entry)     HbA1c, POC (prediabetic range)     HbA1c, POC (controlled diabetic range)      Assessment/Plan: Kiara Greer is a 20 y.o. female with T1DM on a pump (Tslim control IQ) and CGM regimen.   A1c is *** than last visit and is *** the ADA goal of <7.0%.  Dexcom tracing shows she is not meeting goal of TIR >70%. she needs more insulin at ***.    She continues on synthroid for hypothyroidism ***though she has not been taking it consistently. She continues to follow a gluten-free diet most of the time*** and has no GI symptoms.  She also continues on rosuvastatin for hyperlipidemia.  She is doing well on low dose OCP.  When a patient is on insulin, intensive monitoring of blood glucose levels and continuous insulin titration is vital to avoid insulin toxicity leading to severe hypoglycemia. Severe hypoglycemia can lead to seizure or death. Hyperglycemia can also result from inadequate insulin dosing and can lead to ketosis requiring ICU admission and intravenous insulin.   1. ***Type 1 without complications (HCC) - POCT Glucose and POCT HgB A1C as above -Will draw annual diabetes labs ***today (lipid panel, TSH, FT4, urine microalbumin to creatinine ratio) -Encouraged to wear med alert ID every day -Encouraged to rotate injection sites -Provided with my contact information and advised to email/send mychart with questions/need for BG review ***-CGM download reviewed extensively (see interpretation above) ***-School plan completed ***-Rx sent to pharmacy include: ***  2. Insulin pump titration ***2.  Insulin Pump in Place -Made the following pump changes: ***  3. Acquired autoimmune hypothyroidism*** -Continue current levothyroxine. Try moving all meds to evening to improve compliance  4. Celiac disease*** -Continue gluten free diet  5. Hyperlipidemia due to type 1 diabetes mellitus ([place])*** -Continue  rosuvastatin  6. Menorrhagia with irregular cycle*** -Continue current OCP  Follow-up:   No follow-ups on file.   ***   Levon Hedger, MD

## 2020-12-28 ENCOUNTER — Encounter (INDEPENDENT_AMBULATORY_CARE_PROVIDER_SITE_OTHER): Payer: Self-pay | Admitting: Pediatrics

## 2020-12-28 ENCOUNTER — Other Ambulatory Visit: Payer: Self-pay

## 2020-12-28 ENCOUNTER — Ambulatory Visit (INDEPENDENT_AMBULATORY_CARE_PROVIDER_SITE_OTHER): Payer: BC Managed Care – PPO | Admitting: Pediatrics

## 2020-12-28 VITALS — BP 118/80 | HR 82 | Wt 147.2 lb

## 2020-12-28 DIAGNOSIS — K900 Celiac disease: Secondary | ICD-10-CM

## 2020-12-28 DIAGNOSIS — E785 Hyperlipidemia, unspecified: Secondary | ICD-10-CM

## 2020-12-28 DIAGNOSIS — E1069 Type 1 diabetes mellitus with other specified complication: Secondary | ICD-10-CM | POA: Diagnosis not present

## 2020-12-28 DIAGNOSIS — E063 Autoimmune thyroiditis: Secondary | ICD-10-CM

## 2020-12-28 DIAGNOSIS — N921 Excessive and frequent menstruation with irregular cycle: Secondary | ICD-10-CM

## 2020-12-28 DIAGNOSIS — Z9641 Presence of insulin pump (external) (internal): Secondary | ICD-10-CM

## 2020-12-28 LAB — POCT GLYCOSYLATED HEMOGLOBIN (HGB A1C): Hemoglobin A1C: 7.7 % — AB (ref 4.0–5.6)

## 2020-12-28 LAB — POCT GLUCOSE (DEVICE FOR HOME USE): POC Glucose: 281 mg/dl — AB (ref 70–99)

## 2020-12-28 NOTE — Progress Notes (Signed)
Pediatric Endocrinology Diabetes Consultation Follow-up Visit  Lindsy Cerullo Aug 13, 2000 [id number]  Chief Complaint: Follow-up type 1 diabetes, acquired hypothyroidism, celiac disease, abnormal lipids, menorrhagia  Kandace Blitz, MD   HPI: Natsha Guidry is a 20 y.o. female presenting for follow-up of the above concerns.  she is accompanied to this visit alone.  35. Cerys was diagnosed with type 1 diabetes mellitus at age 22 months in 2002; she was not in DKA at diagnosis.  She was initially followed at [facility] pediatric endocrinology. She was started on an insulin pump one week after diagnosis.  She most recently upgraded to a medtronic 530G pump in Summer 2016.  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 Celiac disease was diagnosed in 2008. She was referred to our clinic on 01/14/09 at age 28.5 years.  Her mother and younger sister, [name] also had T1DM and celiac disease. Mother was also hypothyroid then. Sister [name] was initially euthyroid, but developed hypothyroidism in 2012.  She started using a dexcom CGM in 04/2016. She transitioned to a Tslim pump in 11/2018.  2. Since last visit on 09/02/20, she has been well.   ED visits/Hospitalizations: No   Concerns:  -None.  Diabetes has been going well.  Insulin regimen: Tslim settings  Not bolusing for carbs          CGM interpretation: BGs elevated at meal times (autocorrections are helping but only after spikes in BG occur).  Needs to bolus before meals.  Hypoglycemia: Not many lows.  Able to feel low blood sugars.  No glucagon needed recently.  Wearing Med-alert ID currently: No.  Has one on her bookbag. Injection sites: CGM on arms, pump site on back/butt. Annual labs due: 03/2021 Ophthalmology due: 09/2020 (Performed 09/2018 with Dr. Prudencio Burly, no retinopathy).  Had to cancel appt with Dr. Prudencio Burly over the summer; rescheduled for Nov  Acquired hypothyroidism: She takes synthroid  165mg daily (increased 10/2017).   Missing doses: May have missed some.   Energy level: good Sleep: not great, no changes Periods regular: yes, on OCPs below   Celiac disease: Continues on a gluten free diet majority of the time  Weight changes: Weight has decreased 9lb since last visit.   Attributes weight loss to better med adherence.  Hyperlipidemia: She continues on crestor 163mdaily (increased in 10/2018) and a plant based sterol called cholest-off.  Muscle aches: None  Menorrhagia with irregular cycles:  Started on OCPs in 12/2016 for menorrhagia with irregular cycles with improvement in menses though these were stopped in 03/2017 due to concerns they were contributing to depression.  Menses resumed very heavy so she was restarted on OCPs in 07/2017 without change in depression.   Continues on Junel Fe 1/20 Withdrawal bleeding during week of inactive pills: yes Spotting: no   ROS: All systems reviewed with pertinent positives listed below; otherwise negative. Weight down since she has been taking medicine.  No palpitations.  No D/V  Past Medical History:   Past Medical History:  Diagnosis Date   ADHD (attention deficit hyperactivity disorder)    Celiac disease    Diabetes mellitus type I (HCD[place]   Duanes syndrome    Goiter    Hashimoto's thyroiditis    Hyperlipidemia type II    Hypoglycemia associated with diabetes ([facility]   Hypoglycemia unawareness in type 1 diabetes mellitus ([facility]   Hypothyroidism, acquired, autoimmune     Medications:  Outpatient Encounter Medications as of 12/28/2020  Medication Sig Note  Amphetamine Sulfate (EVEKEO) 10 MG TABS Take 1 to 2 tablets by mouth daily.    Calcium-Vitamin D-Vitamin K 149-702-63 MG-UNT-MCG CHEW Chew by mouth.    cetirizine (ZYRTEC) 10 MG tablet Take 10 mg by mouth as needed for allergies.    Continuous Blood Gluc Sensor (DEXCOM G6 SENSOR) MISC Change sensor every 10 days    Continuous Blood Gluc Transmit (DEXCOM G6  TRANSMITTER) MISC CHANGE EVERY 3 MONTHS    Glucagon (BAQSIMI TWO PACK) 3 MG/DOSE POWD Place 1 application into the nose as needed. Use as directed if unconscious, unable to take food po, or having a seizure due to hypoglycemia    insulin aspart (NOVOLOG) 100 UNIT/ML injection 300 units in  Insulin Pump every 48 hours    insulin aspart (NOVOLOG) 100 UNIT/ML injection Inject 300 units in insulin pump every 48-72 hours    Multiple Vitamin (MULTIVITAMIN) tablet Take 1 tablet by mouth daily.      norethindrone-ethinyl estradiol-FE (JUNEL FE 1/20) 1-20 MG-MCG tablet Take 1 tablet by mouth daily.    rosuvastatin (CRESTOR) 10 MG tablet TAKE 1 TABLET BY MOUTH DAILY    sertraline (ZOLOFT) 100 MG tablet TAKE 1 TABLET BY MOUTH 2 TIMES DAILY    SYNTHROID 137 MCG tablet Take 1 tablet (137 mcg total) by mouth daily. Brand name medically necessary    acetone, urine, test strip Check ketones per protocol (Patient not taking: No sig reported)    albuterol (VENTOLIN HFA) 108 (90 Base) MCG/ACT inhaler 1-2 inhalations every 4-6 hours as needed for wheezing. Dispense spacer as needed. 10/10/2018: Keeps for PRN use   fluticasone (FLONASE) 50 MCG/ACT nasal spray 2 sprays in each nostril daily x1 week then 1-2 sprays in each nostril daily. (use smallest dose possible for symptom control after week 1).    insulin aspart (NOVOLOG FLEXPEN) 100 UNIT/ML FlexPen Inject as directed by MD, total daily dose up to 50 units. In case of pump failure. (Patient not taking: No sig reported)    PROAIR HFA 108 (90 Base) MCG/ACT inhaler 1-2 INHALATIONS EVERY 4-6 HOURS AS NEEDED FOR WHEEZING. DISPENSE SPACER AS NEEDED. (Patient not taking: No sig reported) 10/10/2018: Keeps for PRN use   No facility-administered encounter medications on file as of 12/28/2020.  Taking an OTC tablet called cholest-off for hyperlipidemia (contains plant sterols)  Allergies: No Known Allergies  Surgical History: Past Surgical History:  Procedure Laterality Date    NO PAST SURGERIES      Family History:  Family History  Problem Relation Age of Onset   Diabetes Mother    Thyroid disease Mother    Celiac disease Mother    Diabetes Sister    Thyroid disease Sister    Celiac disease Sister    Diabetes Maternal Uncle    Thyroid disease Maternal Grandmother    Celiac disease Maternal Grandmother    Cancer Neg Hx     Social History: Equities trader at [employer]   Physical Exam:  Vitals:   12/28/20 1057  BP: 118/80  Pulse: 82  Weight: 147 lb 4 oz (66.8 kg)    BP 118/80   Pulse 82   Wt 147 lb 4 oz (66.8 kg)   BMI 24.33 kg/m  Body mass index: body mass index is 24.33 kg/m. Growth percentile SmartLinks can only be used for patients less than 28 years old.  Ht Readings from Last 3 Encounters:  09/02/20 5' 5.24" (1.657 m)  07/10/19 5' 5"  (1.651 m)  01/31/18 5' 4.57" (1.64 m) (55 %,  Z= 0.12)*   * Growth percentiles are based on CDC (Girls, 2-20 Years) data.   Wt Readings from Last 3 Encounters:  12/28/20 147 lb 4 oz (66.8 kg)  09/02/20 156 lb (70.8 kg)  03/23/20 152 lb 9.6 oz (69.2 kg)   General: Well developed, well nourished female in no acute distress.  Appears stated age Head: Normocephalic, atraumatic.   Eyes:  Pupils equal and round.  Medial portion of R sclera mildly injected, no drainage. L sclera white.    Ears/Nose/Mouth/Throat: Masked Neck: supple, no cervical lymphadenopathy, no thyromegaly Cardiovascular: regular rate, normal S1/S2, no murmurs Respiratory: No increased work of breathing.  Lungs clear to auscultation bilaterally.  No wheezes. Abdomen: soft, nontender, nondistended.  Extremities: warm, well perfused, cap refill < 2 sec.   Musculoskeletal: Normal muscle mass.  Normal strength Skin: warm, dry.  No rash or lesions. No lipohyperropthy Neurologic: alert and oriented, normal speech, no tremor   Labs: Hemoglobin A1c trend: 9.3%-->10.7%-->10.2% -->9.3%-->8.6%-->8.3% 12/2016-->11.2% 04/2017-->9.5% 07/2017-->8.9%  10/2017-->11% 01/2018, 11.4% 10/2018, 11.1% 03/2020, 8.2% 09/2020, 7.7% 12/2020  Results for orders placed or performed in visit on 12/28/20  POCT glycosylated hemoglobin (Hb A1C)  Result Value Ref Range   Hemoglobin A1C 7.7 (A) 4.0 - 5.6 %   HbA1c POC (<> result, manual entry)     HbA1c, POC (prediabetic range)     HbA1c, POC (controlled diabetic range)    POCT Glucose (Device for Home Use)  Result Value Ref Range   Glucose Fasting, POC     POC Glucose 281 (A) 70 - 99 mg/dl    Assessment/Plan: Yumalay Circle is a 20 y.o. female with T1DM on a pump (Tslim control IQ) and CGM regimen.   A1c is improved than last visit and is just above the ADA goal of <7.0%. She is not bolusing for carbs though control IQ auto boluses are bringing blood sugars back into range.  She has had excellent improvement in A1c from last year at this time.  Discussed that A1c will improve even more if she boluses for carbs before eating.   She continues on synthroid for hypothyroidism. She is clinically euthyroid. She has had weight loss attributed to improved med compliance. She continues to follow a gluten-free diet most of the time and has no GI symptoms.  She also continues on rosuvastatin for hyperlipidemia.  She is doing well on low dose OCP.  When a patient is on insulin, intensive monitoring of blood glucose levels and continuous insulin titration is vital to avoid insulin toxicity leading to severe hypoglycemia. Severe hypoglycemia can lead to seizure or death. Hyperglycemia can also result from inadequate insulin dosing and can lead to ketosis requiring ICU admission and intravenous insulin.   1. Type 1 without complications (HCC) - POCT Glucose and POCT HgB A1C as above -Will draw annual diabetes labs at next visit (lipid panel, TSH, FT4, urine microalbumin to creatinine ratio) -Commended on improvement in A1c -Provided with my contact information and advised to email/send mychart with questions/need for BG  review -CGM download reviewed extensively (see interpretation above)  2.  Insulin Pump in Place -Continue current pump settings.  Advised to bolus when eating to prevent such significant spikes.  3. Acquired autoimmune hypothyroidism -Continue current levothyroxine. Labs at next visit  4. Celiac disease -Continue gluten free diet  5. Hyperlipidemia due to type 1 diabetes mellitus (HCC) -Continue rosuvastatin.    6. Menorrhagia with irregular cycle -Continue current OCP  Follow-up:   Return in about 3  months (around 03/29/2021).   >40 minutes spent today reviewing the medical chart, counseling the patient/family, and documenting today's encounter.   Levon Hedger, MD

## 2020-12-28 NOTE — Patient Instructions (Signed)

## 2021-01-07 ENCOUNTER — Encounter: Payer: Self-pay | Admitting: Family

## 2021-01-11 ENCOUNTER — Telehealth: Payer: BC Managed Care – PPO | Admitting: Family

## 2021-01-28 DIAGNOSIS — E119 Type 2 diabetes mellitus without complications: Secondary | ICD-10-CM | POA: Diagnosis not present

## 2021-01-28 LAB — HM DIABETES EYE EXAM

## 2021-02-01 ENCOUNTER — Telehealth (INDEPENDENT_AMBULATORY_CARE_PROVIDER_SITE_OTHER): Payer: Self-pay | Admitting: Pediatrics

## 2021-02-01 NOTE — Telephone Encounter (Signed)
Received documentation from [facility] Ophthalmology.  Geneal had an appt with Dr. Prudencio Burly on 01/28/21; no retinopathy found. Follow-up in 12 months.  Levon Hedger, MD

## 2021-02-10 DIAGNOSIS — Z23 Encounter for immunization: Secondary | ICD-10-CM | POA: Diagnosis not present

## 2021-02-17 ENCOUNTER — Other Ambulatory Visit (HOSPITAL_COMMUNITY): Payer: Self-pay

## 2021-02-17 ENCOUNTER — Other Ambulatory Visit: Payer: Self-pay

## 2021-02-17 ENCOUNTER — Encounter: Payer: Self-pay | Admitting: Family

## 2021-02-17 ENCOUNTER — Ambulatory Visit: Payer: BC Managed Care – PPO | Admitting: Family

## 2021-02-17 VITALS — BP 112/64 | HR 72 | Resp 16 | Ht 65.0 in | Wt 144.6 lb

## 2021-02-17 DIAGNOSIS — E063 Autoimmune thyroiditis: Secondary | ICD-10-CM | POA: Diagnosis not present

## 2021-02-17 DIAGNOSIS — E7801 Familial hypercholesterolemia: Secondary | ICD-10-CM | POA: Diagnosis not present

## 2021-02-17 DIAGNOSIS — F819 Developmental disorder of scholastic skills, unspecified: Secondary | ICD-10-CM

## 2021-02-17 DIAGNOSIS — K900 Celiac disease: Secondary | ICD-10-CM | POA: Diagnosis not present

## 2021-02-17 DIAGNOSIS — E049 Nontoxic goiter, unspecified: Secondary | ICD-10-CM

## 2021-02-17 DIAGNOSIS — R278 Other lack of coordination: Secondary | ICD-10-CM

## 2021-02-17 DIAGNOSIS — Z79899 Other long term (current) drug therapy: Secondary | ICD-10-CM

## 2021-02-17 DIAGNOSIS — F411 Generalized anxiety disorder: Secondary | ICD-10-CM

## 2021-02-17 DIAGNOSIS — Z7189 Other specified counseling: Secondary | ICD-10-CM

## 2021-02-17 DIAGNOSIS — H5089 Other specified strabismus: Secondary | ICD-10-CM

## 2021-02-17 DIAGNOSIS — F900 Attention-deficit hyperactivity disorder, predominantly inattentive type: Secondary | ICD-10-CM | POA: Diagnosis not present

## 2021-02-17 DIAGNOSIS — E10649 Type 1 diabetes mellitus with hypoglycemia without coma: Secondary | ICD-10-CM

## 2021-02-17 DIAGNOSIS — Z719 Counseling, unspecified: Secondary | ICD-10-CM

## 2021-02-17 DIAGNOSIS — E1010 Type 1 diabetes mellitus with ketoacidosis without coma: Secondary | ICD-10-CM

## 2021-02-17 MED ORDER — AMPHETAMINE SULFATE 10 MG PO TABS
10.0000 mg | ORAL_TABLET | Freq: Every day | ORAL | 0 refills | Status: DC
  Filled 2021-02-17 – 2021-03-04 (×3): qty 60, 30d supply, fill #0

## 2021-02-17 NOTE — Progress Notes (Signed)
Medication Check  Patient ID: Kiara Greer  DOB: [birth year]  MRN: [id number]  DATE:02/17/21 Kandace Blitz, MD (Inactive)  Accompanied by:  self Patient Lives with:  roommate on campus  HISTORY/CURRENT STATUS: HPI Patient here by herself for the visit today. Patient interactive and appropriate with provider. Pateint has done well with no issues this semester. Will graduate in May 2023 and looking for a job. No significant changes reported and will continue with current medication regimen.   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   Driving: None reported  MEDICAL HISTORY: Appetite: Good   Sleep: Bedtime: 1:00 am   Awakens: 6-7:00 am    Concerns: Initiation/Maintenance/Other: Not getting enough Elimination: None   Individual Medical History/ Review of Systems: Changes? :Yes, seen Dr. Charna Archer, Endocrinology, and Dr. Prudencio Burly, eye doctor with no changes for either visit.   Family Medical/ Social History: Changes? None   MENTAL HEALTH: Anxiety-Zoloft 100 mg daily.   PHYSICAL EXAM; Vitals:   02/17/21 0917  BP: 112/64  Pulse: 72  Resp: 16  Weight: 144 lb 9.6 oz (65.6 kg)  Height: 5' 5"  (1.651 m)  Physical Exam Vitals reviewed.  Constitutional:      Appearance: Normal appearance. She is well-developed and normal weight.  HENT:     Head: Normocephalic and atraumatic.     Right Ear: Tympanic membrane, ear canal and external ear normal.     Left Ear: Tympanic membrane, ear canal and external ear normal.     Nose: Nose normal.     Mouth/Throat:     Mouth: Mucous membranes are moist.  Eyes:     Extraocular Movements: Extraocular movements  intact.     Conjunctiva/sclera: Conjunctivae normal.     Pupils: Pupils are equal, round, and reactive to light.  Cardiovascular:     Rate and Rhythm: Normal rate and regular rhythm.     Pulses: Normal pulses.     Heart sounds: Normal heart sounds.  Pulmonary:     Effort: Pulmonary effort is normal.     Breath sounds: Normal breath sounds.  Abdominal:     General: Bowel sounds are normal.     Palpations: Abdomen is soft.  Musculoskeletal:        General: Normal range of motion.     Cervical back: Normal range of motion and neck supple.  Skin:    General: Skin is warm and dry.     Capillary Refill: Capillary refill takes less than 2 seconds.  Neurological:     General: No focal deficit present.     Mental Status: She is alert and oriented to person, place, and time.     Deep Tendon Reflexes: Reflexes are normal and symmetric.  Psychiatric:        Mood and Affect: Mood normal.        Behavior: Behavior normal.        Thought Content: Thought content normal.        Judgment: Judgment normal.   General Physical Exam: Unchanged from previous exam, date:10/29/2020   Side Effects (None 0, Mild 1, Moderate 2, Severe 3)  Headache 0  Stomachache 0  Change of appetite 0  Trouble sleeping 0  Irritability in the later morning, later afternoon , or  evening 0  Socially withdrawn - decreased interaction with others 0  Extreme sadness or unusual crying 0  Dull, tired, listless behavior 0  Tremors/feeling shaky 0  Repetitive movements, tics, jerking, twitching, eye blinking 0  Picking at skin or fingers nail biting, lip or cheek chewing 0  Sees or hears things that aren't there 0  Comments:  None  ASSESSMENT:  Kiara Greer is 41-years of age with a diagnosis of ADHD, Anxiety and Learning that is well controlled on her Evekeo and Zoloft. No side effects and reported efficacy during the day. Academically she is doing well with graduation in May of 2023. Has services in place at school for  learning support needed. 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 Frida has had no significant changes with eating, sleeping or health in the past 3 months. No changes to her medication or dosing a the visit. F/u for routine care in 3 months.   DIAGNOSES:    ICD-10-CM   1. ADHD (attention deficit hyperactivity disorder), inattentive type  F90.0     2. Hashimoto's thyroiditis  E06.3     3. Celiac disease  K90.0     4. Hyperlipidemia type II  E78.01     5. Goiter  E04.9     6. 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 Diabetic ketoacidosis without coma associated with type 1 diabetes mellitus (HCC)  E10.10     8. Hypoglycemia unawareness in type 1 diabetes mellitus ([place])  E10.649     9. 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 Learning difficulty  F81.9     11. Dysgraphia  R27.8     12. Generalized anxiety disorder  F41.1     13. Medication management  Z79.899     14. Patient counseled  Z71.9     15. Goals of care, counseling/discussion  Z71.89      RECOMMENDATIONS:  Updates for school, classes, schedule, internship and graduation in May of 2023.  Currently is getting services at school through disability services for academic support.  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 Also will search for internship this summer.   Dietary intake has been more consistent with healthy variety of foods and more water each day.   Sleep schedule discussed with limited hours due to school or homework. Hygiene discussed for better sleep schedule.  Structure and schedule consistency each day will provide better progress along with academic success.   Counseled medication pharmacokinetics, options, dosage, administration, desired effects, and possible side effects.   Zoloft 100 mg daily, no Rx today Evekeo 10 mg 1-2 daily, # 60 with no RF's RX for above  e-scribed and sent to pharmacy on record   [address] [place] [address] Phone: [phone] Fax: [phone]   I discussed the assessment and treatment plan with the patient. The patient/ was provided an opportunity to ask questions and all were answered. The patient agreed with the plan and demonstrated an understanding of the instructions.  NEXT APPOINTMENT:  Return in about 3 months (around 05/20/2021) for f/u visit.   The patient was advised to call back or seek an in-person evaluation if the symptoms worsen or if the condition fails to improve as anticipated.    Carolann Littler, NP

## 2021-02-18 ENCOUNTER — Encounter: Payer: Self-pay | Admitting: Family

## 2021-02-21 ENCOUNTER — Other Ambulatory Visit: Payer: Self-pay

## 2021-02-21 DIAGNOSIS — F411 Generalized anxiety disorder: Secondary | ICD-10-CM

## 2021-02-25 ENCOUNTER — Other Ambulatory Visit (HOSPITAL_COMMUNITY): Payer: Self-pay

## 2021-03-04 ENCOUNTER — Other Ambulatory Visit (HOSPITAL_COMMUNITY): Payer: Self-pay

## 2021-03-24 DIAGNOSIS — E1065 Type 1 diabetes mellitus with hyperglycemia: Secondary | ICD-10-CM | POA: Diagnosis not present

## 2021-04-01 DIAGNOSIS — E1065 Type 1 diabetes mellitus with hyperglycemia: Secondary | ICD-10-CM | POA: Diagnosis not present

## 2021-05-10 ENCOUNTER — Other Ambulatory Visit: Payer: Self-pay

## 2021-05-10 ENCOUNTER — Telehealth (INDEPENDENT_AMBULATORY_CARE_PROVIDER_SITE_OTHER): Payer: BC Managed Care – PPO | Admitting: Family

## 2021-05-10 ENCOUNTER — Encounter: Payer: Self-pay | Admitting: Family

## 2021-05-10 ENCOUNTER — Other Ambulatory Visit (HOSPITAL_COMMUNITY): Payer: Self-pay

## 2021-05-10 DIAGNOSIS — K900 Celiac disease: Secondary | ICD-10-CM

## 2021-05-10 DIAGNOSIS — F819 Developmental disorder of scholastic skills, unspecified: Secondary | ICD-10-CM

## 2021-05-10 DIAGNOSIS — F900 Attention-deficit hyperactivity disorder, predominantly inattentive type: Secondary | ICD-10-CM

## 2021-05-10 DIAGNOSIS — Z7189 Other specified counseling: Secondary | ICD-10-CM

## 2021-05-10 DIAGNOSIS — R278 Other lack of coordination: Secondary | ICD-10-CM

## 2021-05-10 DIAGNOSIS — E049 Nontoxic goiter, unspecified: Secondary | ICD-10-CM

## 2021-05-10 DIAGNOSIS — H5089 Other specified strabismus: Secondary | ICD-10-CM

## 2021-05-10 DIAGNOSIS — F411 Generalized anxiety disorder: Secondary | ICD-10-CM

## 2021-05-10 DIAGNOSIS — E063 Autoimmune thyroiditis: Secondary | ICD-10-CM

## 2021-05-10 DIAGNOSIS — Z8639 Personal history of other endocrine, nutritional and metabolic disease: Secondary | ICD-10-CM

## 2021-05-10 DIAGNOSIS — E7800 Pure hypercholesterolemia, unspecified: Secondary | ICD-10-CM | POA: Diagnosis not present

## 2021-05-10 DIAGNOSIS — F909 Attention-deficit hyperactivity disorder, unspecified type: Secondary | ICD-10-CM | POA: Insufficient documentation

## 2021-05-10 DIAGNOSIS — Z719 Counseling, unspecified: Secondary | ICD-10-CM

## 2021-05-10 DIAGNOSIS — Z79899 Other long term (current) drug therapy: Secondary | ICD-10-CM

## 2021-05-10 MED ORDER — AMPHETAMINE SULFATE 10 MG PO TABS
10.0000 mg | ORAL_TABLET | Freq: Every day | ORAL | 0 refills | Status: DC
  Filled 2021-05-10: qty 60, 30d supply, fill #0

## 2021-05-10 MED ORDER — ESCITALOPRAM OXALATE 5 MG PO TABS
5.0000 mg | ORAL_TABLET | Freq: Every day | ORAL | 1 refills | Status: DC
  Filled 2021-05-10: qty 30, 30d supply, fill #0

## 2021-05-10 NOTE — Progress Notes (Signed)
[facility] [address] [place]. [address] [place] [address] Dept: [phone] Dept Fax: [phone]  Medication Check visit via Virtual Video   Patient ID:  Kiara Greer  female DOB: Jun 28, 2000   20 y.o.   MRN: [id number]   DATE:05/10/21 PCP: Santa Genera, MD  Virtual Visit via Video Note  I connected with  Kiara Greer on 05/10/21 at  7:30 AM EST by a video enabled telemedicine application and verified that I am speaking with the correct person using two identifiers. Patient/Parent Location: at school   I discussed the limitations, risks, security and privacy concerns of performing an evaluation and management service by telephone and the availability of in person appointments. I also discussed with the parents that there may be a patient responsible charge related to this service. The parents expressed understanding and agreed to proceed.  Provider: Carron Curie, NP  Location: work location  HPI/CURRENT STATUS: Kiara Greer is here for medication management of the psychoactive medications for ADHD and review of educational and behavioral concerns.   Kiara Greer currently taking Zoloft and Evekeo, which is working well. Takes medication daily in the morning. Medication tends to wear off around evening time, some times will take another Evekeo. Had started taking her Zoloft after dinner. Kiara Greer is able to focus through school & homework.   Kiara Greer is eating well (eating breakfast, lunch and dinner). Kiara Greer does not have appetite suppression  Sleeping well (getting plenty of sleep each night), sleeping through the night. Kiara Greer does not have delayed sleep onset  EDUCATION: School: [employer]-Graduation is May 19th Year/Grade:  college senior   Performance/ Grades: above average Services: Other: Disability Services  Activities/ Exercise: intermittently-walking   MEDICAL HISTORY: Individual Medical  History/ Review of Systems: None reported recently  Has been healthy with no visits to the PCP. WCC due yearly and routine f/u visits.   Family Medical/ Social History: Changes? None Patient Lives with:  roommate  on campus  MENTAL HEALTH: Mental Health Issues: Anxiety-more recently with her Zoloft, getting more stomach aches with her Zoloft as well.   Allergies: No Known Allergies  Current Medications:  Current Outpatient Medications  Medication Instructions   acetone, urine, test strip Check ketones per protocol   albuterol (VENTOLIN HFA) 108 (90 Base) MCG/ACT inhaler 1-2 inhalations every 4-6 hours as needed for wheezing. Dispense spacer as needed.   Amphetamine Sulfate (EVEKEO) 10 MG TABS Take 1 to 2 tablets by mouth daily.   Calcium-Vitamin D-Vitamin K 500-100-40 MG-UNT-MCG CHEW Oral,     cetirizine (ZYRTEC) 10 mg, Oral, As needed   Continuous Blood Gluc Sensor (DEXCOM G6 SENSOR) MISC Change sensor every 10 days   Continuous Blood Gluc Transmit (DEXCOM G6 TRANSMITTER) MISC CHANGE EVERY 3 MONTHS   escitalopram (LEXAPRO) 5 MG tablet Take 1 tablet by mouth daily.   fluticasone (FLONASE) 50 MCG/ACT nasal spray 2 sprays in each nostril daily x1 week then 1-2 sprays in each nostril daily. (use smallest dose possible for symptom control after week 1).   Glucagon (BAQSIMI TWO PACK) 3 MG/DOSE POWD 1 application, Nasal, As needed, Use as directed if unconscious, unable to take food po, or having a seizure due to hypoglycemia   insulin aspart (NOVOLOG FLEXPEN) 100 UNIT/ML FlexPen Inject as directed by MD, total daily dose up to 50 units. In case of pump failure.   insulin aspart (NOVOLOG) 100 UNIT/ML injection 300 units in  Insulin Pump every 48 hours  insulin aspart (NOVOLOG) 100 UNIT/ML injection Inject 300 units in insulin pump every 48-72 hours   Multiple Vitamin (MULTIVITAMIN) tablet 1 tablet, Oral, Daily,     norethindrone-ethinyl estradiol-FE (JUNEL FE 1/20) 1-20 MG-MCG tablet 1 tablet,  Oral, Daily   PROAIR HFA 108 (90 Base) MCG/ACT inhaler    rosuvastatin (CRESTOR) 10 MG tablet TAKE 1 TABLET BY MOUTH DAILY   Synthroid 137 mcg, Oral, Daily, Brand name medically necessary   Medication Side Effects: Nausea from the Zoloft  DIAGNOSES:    ICD-10-CM   1. Attention deficit hyperactivity disorder (ADHD), predominantly inattentive type  F90.0     2. Hypercholesterolemia  E78.00     3. Celiac disease  K90.0     4. Goiter  E04.9     5. Hashimoto's thyroiditis  E06.3     6. Learning difficulty  F81.9     7. Dysgraphia  R27.8     8. 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 History of diabetes mellitus, type I  Z86.39     11. Medication management  Z79.899     12. Generalized anxiety disorder  F41.1     13. Patient counseled  Z71.9     14. Goals of care, counseling/discussion  Z71.89      ASSESSMENT:   Kiara Greer is a 21 year old female with a history of ADHD, learning, Dysgraphia and Anxiety. She has been on Zoloft and Evekeo with some difficulties with stomach pains along with nausea when taking on the same day. Academically doing well with no current issues with her courses and getting disability services on campus. Graduation is Aug 19, 2021 and will start job searching for her major. No recent medical changes or f/u visits with specialists recently. Sleeping with no reported issues. Eating with no changes or difficulties. Walking on campus to get exercise. To continue with her Evekeo 20 mg most days and will discuss options for anxiety treatments.   PLAN/RECOMMENDATIONS:  Updates with college classes, J-term, graduation and potential jobs after graduation.  Services have continued with disability services for academics and attention issues.   Discussed graduation with potential jobs after graduation. Support and information provided for future employment.  Encouraged exercise and healthy eating  habits for daily habits along with increasing water intake.   Medical updates with future appointments for specialist in the next few months.   Sleep habits and schedule have been consistent. No concerns and no reported issues from patient. Support provided for continuation of daily routine and schedule.   Counseled medication pharmacokinetics, options, dosage, administration, desired effects, and possible side effects.   Evekeo 10 mg 1-2 daily, # 60 with no RF's.  Discontinued Zoloft with titration information to stop the medication. Start Lexapro 5 mg daily after d/c'ing Zoloft with instructions provided over the next month of increasing the dose, # 30 with 1 RF's.RX for above e-scribed and sent to pharmacy on record  [facility] [address] [place] [address] Phone: [phone] Fax: [phone]  I discussed the assessment and treatment plan with the patient. The patient was provided an opportunity to ask questions and all were answered. The patient agreed with the plan and demonstrated an understanding of the instructions.   NEXT APPOINTMENT:  Visit date not found-f/u visit Telehealth OK  The patient was advised to call back or seek an in-person evaluation if the symptoms worsen or if the condition  fails to improve as anticipated.   Carron Curie, NP

## 2021-06-07 ENCOUNTER — Telehealth: Payer: Self-pay

## 2021-06-07 NOTE — Telephone Encounter (Signed)
Sent mychart note to have pt schedule a follow-up visit ?

## 2021-06-23 ENCOUNTER — Other Ambulatory Visit: Payer: Self-pay

## 2021-06-23 MED ORDER — AMPHETAMINE SULFATE 10 MG PO TABS
10.0000 mg | ORAL_TABLET | Freq: Every day | ORAL | 0 refills | Status: DC
  Filled 2021-06-23: qty 60, 30d supply, fill #0

## 2021-06-23 NOTE — Telephone Encounter (Signed)
Evekeo 10 mg 1-2 tablets daily, # 60 with no RF's.RX for above e-scribed and sent to pharmacy on record ? ? ?[facility] ?[address] ?[place] [address] ?Phone: [phone] Fax: [phone] ? ? ? ?

## 2021-06-24 ENCOUNTER — Other Ambulatory Visit (HOSPITAL_COMMUNITY): Payer: Self-pay

## 2021-07-18 ENCOUNTER — Other Ambulatory Visit (INDEPENDENT_AMBULATORY_CARE_PROVIDER_SITE_OTHER): Payer: Self-pay | Admitting: Pediatrics

## 2021-07-18 DIAGNOSIS — N921 Excessive and frequent menstruation with irregular cycle: Secondary | ICD-10-CM

## 2021-07-19 ENCOUNTER — Encounter (INDEPENDENT_AMBULATORY_CARE_PROVIDER_SITE_OTHER): Payer: Self-pay

## 2021-07-21 ENCOUNTER — Ambulatory Visit (INDEPENDENT_AMBULATORY_CARE_PROVIDER_SITE_OTHER): Payer: BC Managed Care – PPO | Admitting: Pediatrics

## 2021-07-21 ENCOUNTER — Encounter (INDEPENDENT_AMBULATORY_CARE_PROVIDER_SITE_OTHER): Payer: Self-pay | Admitting: Pediatrics

## 2021-07-21 VITALS — BP 100/64 | HR 92 | Wt 139.8 lb

## 2021-07-21 DIAGNOSIS — F419 Anxiety disorder, unspecified: Secondary | ICD-10-CM | POA: Insufficient documentation

## 2021-07-21 DIAGNOSIS — E1069 Type 1 diabetes mellitus with other specified complication: Secondary | ICD-10-CM

## 2021-07-21 DIAGNOSIS — E1065 Type 1 diabetes mellitus with hyperglycemia: Secondary | ICD-10-CM | POA: Diagnosis not present

## 2021-07-21 DIAGNOSIS — K900 Celiac disease: Secondary | ICD-10-CM | POA: Diagnosis not present

## 2021-07-21 DIAGNOSIS — E063 Autoimmune thyroiditis: Secondary | ICD-10-CM | POA: Diagnosis not present

## 2021-07-21 DIAGNOSIS — F32A Depression, unspecified: Secondary | ICD-10-CM | POA: Insufficient documentation

## 2021-07-21 DIAGNOSIS — E785 Hyperlipidemia, unspecified: Secondary | ICD-10-CM

## 2021-07-21 DIAGNOSIS — N921 Excessive and frequent menstruation with irregular cycle: Secondary | ICD-10-CM

## 2021-07-21 DIAGNOSIS — Z9641 Presence of insulin pump (external) (internal): Secondary | ICD-10-CM

## 2021-07-21 LAB — POCT GLYCOSYLATED HEMOGLOBIN (HGB A1C): Hemoglobin A1C: 7.9 % — AB (ref 4.0–5.6)

## 2021-07-21 LAB — POCT GLUCOSE (DEVICE FOR HOME USE): POC Glucose: 241 mg/dl — AB (ref 70–99)

## 2021-07-21 NOTE — Progress Notes (Signed)
Pediatric Endocrinology Diabetes Consultation Follow-up Visit ? ?[name] ?May 26, 2000 ?[id number] ? ?Chief Complaint: Follow-up type 1 diabetes, acquired hypothyroidism, celiac disease, abnormal lipids, menorrhagia ? ?Kandace Blitz, MD ? ? ?HPI: ?Kiara Greer is a 21 y.o. female presenting for follow-up of the above concerns.  she attended this visit alone. ? ?1. Nickey was diagnosed with type 1 diabetes mellitus at age 23 months in 2002; she was not in DKA at diagnosis.  She was initially followed at [facility] pediatric endocrinology. She was started on an insulin pump one week after diagnosis.  She most recently upgraded to a medtronic 530G pump in Summer 2016.  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 Celiac disease was diagnosed in 2008. She was referred to our clinic on 01/14/09 at age 12.5 years.  Her mother and younger sister, [name] also had T1DM and celiac disease. Mother was also hypothyroid then. Sister [name] was initially euthyroid, but developed hypothyroidism in 2012.  She started using a dexcom CGM in 04/2016. She transitioned to a Tslim pump in 11/2018. ? ?2. Since last visit on 12/28/20, she has been well.   ?ED visits/Hospitalizations: No  ? ?Concerns:  ?-None.  Looking for a job now, graduates in 61 month.   ? ?Insulin regimen: ?Tslim settings  ? ? ? ?CGM: ? ? ?CGM interpretation: Overall blood sugars look good, needs to bolus more at meals ? ?Hypoglycemia: Not many lows.  Able to feel low blood sugars.  No glucagon needed recently.  ?Wearing Med-alert ID currently: Not today.   ?Injection sites: CGM on arms, pump site on butt/abd ?Annual labs due: 03/2021- phlebotomist not available today.  Labs ordered; advised to return for lab draw.  ?Ophthalmology due: 02/2022 (visit 02/2021 showed no retinopathy) ? ?Acquired hypothyroidism: ?She takes synthroid 175mg daily (increased 10/2017).   ?Missing doses: sometimes   ?Energy level: good ?Sleep: not  great, no changes ?Periods regular: yes, on OCPs below  ? ?Celiac disease: ?Continues on a gluten free diet 100% of the time since Jan.  Had rash after eating gluten and felt bad, so no gluten since  ?Weight changes: Weight has decreased 8lb since last visit. No bloody stools.  Not trying to lose weight.   Attributes weight loss to cutting out gluten.  ? ?Hyperlipidemia: ?She continues on crestor 127mdaily (increased in 10/2018) and a plant based sterol called cholest-off.  ?No concerns.  Due for labs ? ?Menorrhagia with irregular cycles:  ?Started on OCPs in 12/2016 for menorrhagia with irregular cycles with improvement in menses though these were stopped in 03/2017 due to concerns they were contributing to depression.  Menses resumed very heavy so she was restarted on OCPs in 07/2017 without change in depression.   ?Continues on Junel Fe 1/20 ?Withdrawal bleeding during week of inactive pills: yes.  No bad cramps ?Spotting: no ?  ?ROS: ?All systems reviewed with pertinent positives listed below; otherwise negative. ? ?Past Medical History:   ?Past Medical History:  ?Diagnosis Date  ? ADHD (attention deficit hyperactivity disorder)   ? Celiac disease   ? Diabetes mellitus type I ([facility]  ? Duanes syndrome   ? Goiter   ? Hashimoto's thyroiditis   ? Hyperlipidemia type II   ? Hypoglycemia associated with diabetes ([place]  ? Hypoglycemia unawareness in type 1 diabetes mellitus (HCO[facility]  ? Hypothyroidism, acquired, autoimmune   ? ? ?Medications:  ?Outpatient Encounter Medications as of 07/21/2021  ?Medication Sig Note  ? Amphetamine  Sulfate (EVEKEO) 10 MG TABS Take 1 to 2 tablets by mouth daily.   ? BLISOVI FE 1/20 1-20 MG-MCG tablet TAKE 1 TABLET DAILY   ? Continuous Blood Gluc Sensor (DEXCOM G6 SENSOR) MISC Change sensor every 10 days   ? Continuous Blood Gluc Transmit (DEXCOM G6 TRANSMITTER) MISC CHANGE EVERY 3 MONTHS   ? escitalopram (LEXAPRO) 5 MG tablet Take 1 tablet by mouth daily.   ? Glucagon (BAQSIMI TWO PACK) 3  MG/DOSE POWD Place 1 application into the nose as needed. Use as directed if unconscious, unable to take food po, or having a seizure due to hypoglycemia   ? insulin aspart (NOVOLOG) 100 UNIT/ML injection Inject 300 units in insulin pump every 48-72 hours   ? levothyroxine (SYNTHROID) 137 MCG tablet 1 tablet in the morning on an empty stomach   ? rosuvastatin (CRESTOR) 10 MG tablet TAKE 1 TABLET BY MOUTH DAILY   ? SYNTHROID 137 MCG tablet Take 1 tablet (137 mcg total) by mouth daily. Brand name medically necessary   ? acetone, urine, test strip Check ketones per protocol (Patient not taking: Reported on 09/02/2020)   ? albuterol (VENTOLIN HFA) 108 (90 Base) MCG/ACT inhaler 1-2 inhalations every 4-6 hours as needed for wheezing. Dispense spacer as needed. 10/10/2018: Keeps for PRN use  ? Calcium-Vitamin D-Vitamin K 179-150-56 MG-UNT-MCG CHEW Chew by mouth. (Patient not taking: Reported on 07/21/2021)   ? cetirizine (ZYRTEC) 10 MG tablet Take 10 mg by mouth as needed for allergies. (Patient not taking: Reported on 07/21/2021)   ? fluticasone (FLONASE) 50 MCG/ACT nasal spray 2 sprays in each nostril daily x1 week then 1-2 sprays in each nostril daily. (use smallest dose possible for symptom control after week 1).   ? insulin aspart (NOVOLOG FLEXPEN) 100 UNIT/ML FlexPen Inject as directed by MD, total daily dose up to 50 units. In case of pump failure. (Patient not taking: Reported on 09/02/2020)   ? insulin aspart (NOVOLOG) 100 UNIT/ML injection 300 units in  Insulin Pump every 48 hours (Patient not taking: Reported on 07/21/2021)   ? Multiple Vitamin (MULTIVITAMIN) tablet Take 1 tablet by mouth daily.   (Patient not taking: Reported on 07/21/2021)   ? PROAIR HFA 108 (90 Base) MCG/ACT inhaler  10/10/2018: Keeps for PRN use  ? sertraline (ZOLOFT) 100 MG tablet 1 tablet (Patient not taking: Reported on 07/21/2021)   ? ?No facility-administered encounter medications on file as of 07/21/2021.  ?Taking an OTC tablet called cholest-off  for hyperlipidemia (contains plant sterols) ? ?Allergies: ?No Known Allergies ? ?Surgical History: ?Past Surgical History:  ?Procedure Laterality Date  ? NO PAST SURGERIES    ? ? ?Family History:  ?Family History  ?Problem Relation Age of Onset  ? Diabetes Mother   ? Thyroid disease Mother   ? Celiac disease Mother   ? Diabetes Sister   ? Thyroid disease Sister   ? Celiac disease Sister   ? Diabetes Maternal Uncle   ? Thyroid disease Maternal Grandmother   ? Celiac disease Maternal Grandmother   ? Cancer Neg Hx   ?  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 ?Physical Exam:  ?Vitals:  ? 07/21/21 0934  ?BP: 100/64  ?Pulse: 92  ?Weight: 139 lb 12.8 oz (63.4 kg)  ? ?BP 100/64   Pulse 92   Wt 139 lb 12.8 oz (63.4 kg)   BMI 23.26 kg/m?  ?Body mass index: body mass index is 23.26 kg/m?Marland Kitchen ?Growth percentile SmartLinks can only be used for patients less  than 57 years old. ? ?Ht Readings from Last 3 Encounters:  ?09/02/20 5' 5.24" (1.657 m)  ?07/10/19 5' 5"  (1.651 m)  ?01/31/18 5' 4.57" (1.64 m) (55 %, Z= 0.12)*  ? ?* Growth percentiles are based on CDC (Girls, 2-20 Years) data.  ? ?Wt Readings from Last 3 Encounters:  ?07/21/21 139 lb 12.8 oz (63.4 kg)  ?12/28/20 147 lb 4 oz (66.8 kg)  ?09/02/20 156 lb (70.8 kg)  ? ?General: Well developed, well nourished female in no acute distress.  Appears stated age ?Head: Normocephalic, atraumatic.   ?Eyes:  Pupils equal and round.  Sclera white.  No eye drainage.   ?Ears/Nose/Mouth/Throat: Nares patent, no nasal drainage.  Normal dentition, mucous membranes moist.   ?Neck: supple, no cervical lymphadenopathy, no thyromegaly ?Cardiovascular: regular rate, normal S1/S2, no murmurs ?Respiratory: No increased work of breathing.  Lungs clear to auscultation bilaterally.  No wheezes. ?Abdomen: soft, nontender, nondistended. No appreciable masses  ?Extremities: warm, well perfused, cap refill < 2 sec.   ?Musculoskeletal: Normal muscle mass.  Normal strength ?Skin: warm, dry.  No rash or  lesions. ?Neurologic: alert and oriented, normal speech, no tremor  ? ?Labs: ?Hemoglobin A1c trend: 9.3%-->10.7%-->10.2% -->9.3%-->8.6%-->8.3% 12/2016-->11.2% 04/2017-->9.5% 07/2017-->8.9% 10/2017-->11% 01/2018, 11.4% 7/20

## 2021-07-21 NOTE — Patient Instructions (Addendum)
It was a pleasure to see you in clinic today.   ?Feel free to contact our office during normal business hours at [phone] with questions or concerns. ?If you need [place] urgently after normal business hours, please call the above number to reach our answering service who will contact the on-call pediatric endocrinologist. ? ?If you choose to communicate with [place] via MyChart, please do not send urgent messages as this inbox is NOT monitored on nights or weekends.  Urgent concerns should be discussed with the on-call pediatric endocrinologist. ? ?-Always have fast sugar with you in case of low blood sugar (glucose tabs, regular juice or soda, candy) ?-Always wear your ID that states you have diabetes ?-Always bring your meter/continuous glucose monitor to your visit ?-Call/Email if you want to review blood sugars ? ?Remember to bolus! ?Come back any day except Thursday for labs ?

## 2021-08-17 ENCOUNTER — Telehealth (INDEPENDENT_AMBULATORY_CARE_PROVIDER_SITE_OTHER): Payer: Self-pay | Admitting: Pharmacist

## 2021-08-17 ENCOUNTER — Encounter (INDEPENDENT_AMBULATORY_CARE_PROVIDER_SITE_OTHER): Payer: Self-pay | Admitting: Pediatrics

## 2021-08-17 NOTE — Telephone Encounter (Signed)
Submitted order for [employer] 90 9 mm 23 inch tubing (grey color) and cartridges to change pump every 2.5-3 days on [email or web address] ? ? ? ?Thank you for involving clinical pharmacist/diabetes educator to assist in providing this patient's care.  ? ?Drexel Iha, PharmD, BCACP, [place], CPP ? ?

## 2021-08-22 ENCOUNTER — Telehealth (INDEPENDENT_AMBULATORY_CARE_PROVIDER_SITE_OTHER): Payer: BC Managed Care – PPO | Admitting: Family

## 2021-08-22 ENCOUNTER — Encounter: Payer: Self-pay | Admitting: Family

## 2021-08-22 ENCOUNTER — Telehealth: Payer: Self-pay

## 2021-08-22 ENCOUNTER — Other Ambulatory Visit (HOSPITAL_COMMUNITY): Payer: Self-pay

## 2021-08-22 DIAGNOSIS — Z79899 Other long term (current) drug therapy: Secondary | ICD-10-CM

## 2021-08-22 DIAGNOSIS — F411 Generalized anxiety disorder: Secondary | ICD-10-CM

## 2021-08-22 DIAGNOSIS — Z719 Counseling, unspecified: Secondary | ICD-10-CM

## 2021-08-22 DIAGNOSIS — E063 Autoimmune thyroiditis: Secondary | ICD-10-CM | POA: Diagnosis not present

## 2021-08-22 DIAGNOSIS — K900 Celiac disease: Secondary | ICD-10-CM

## 2021-08-22 DIAGNOSIS — F900 Attention-deficit hyperactivity disorder, predominantly inattentive type: Secondary | ICD-10-CM | POA: Diagnosis not present

## 2021-08-22 DIAGNOSIS — Z8639 Personal history of other endocrine, nutritional and metabolic disease: Secondary | ICD-10-CM

## 2021-08-22 DIAGNOSIS — F819 Developmental disorder of scholastic skills, unspecified: Secondary | ICD-10-CM

## 2021-08-22 DIAGNOSIS — E049 Nontoxic goiter, unspecified: Secondary | ICD-10-CM

## 2021-08-22 DIAGNOSIS — Z7189 Other specified counseling: Secondary | ICD-10-CM

## 2021-08-22 DIAGNOSIS — F32A Depression, unspecified: Secondary | ICD-10-CM

## 2021-08-22 DIAGNOSIS — R278 Other lack of coordination: Secondary | ICD-10-CM

## 2021-08-22 DIAGNOSIS — E7801 Familial hypercholesterolemia: Secondary | ICD-10-CM

## 2021-08-22 MED ORDER — ESCITALOPRAM OXALATE 10 MG PO TABS
10.0000 mg | ORAL_TABLET | Freq: Every day | ORAL | 0 refills | Status: DC
  Filled 2021-08-22: qty 90, 90d supply, fill #0

## 2021-08-22 MED ORDER — AMPHETAMINE SULFATE 10 MG PO TABS
10.0000 mg | ORAL_TABLET | Freq: Every day | ORAL | 0 refills | Status: DC
  Filled 2021-08-22: qty 60, 30d supply, fill #0

## 2021-08-22 NOTE — Progress Notes (Signed)
Kiara Greer Kiara Greer. [address] [place] [address] Dept: [phone] Dept Fax: [phone]  Medication Check visit via Virtual Video   Patient ID:  Kiara Greer  female DOB: 2001-03-16   21 y.o.   MRN: [id number]   DATE:08/22/21  PCP: Kandace Blitz, MD  Virtual Visit via Video Note  I connected with  Kiara Greer on 08/22/21 at  9:00 AM EDT by a video enabled telemedicine application and verified that I am speaking with the correct person using two identifiers. Patient/Parent Location: in a pared car   I discussed the limitations, risks, security and privacy concerns of performing an evaluation and management service by telephone and the availability of in person appointments. I also discussed with the parents that there may be a patient responsible charge related to this service. The parents expressed understanding and agreed to proceed.  Provider: Carolann Littler, NP  Location: private work location  HPI/CURRENT STATUS: Kiara Greer is here for medication management of the psychoactive medications for ADHD and review of educational and behavioral concerns.   Kiara Greer currently taking Evekeo 10 mg 1-2 tablets most days and Lexapro 5 mg daily, which is working well. Takes medication daily in the morning. Medication tends to wear off around evening for the [facility]. Kiara Greer is able to focus through school & homework.   Kiara Greer is eating well (eating breakfast, lunch and dinner). Kiara Greer does not have appetite suppression  Sleeping well (getting plenty of sleep each night), sleeping through the night. Kiara Greer does not have delayed sleep onset  EDUCATION: School: 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 Has been healthy with no visits to the PCP. Kiara Greer due yearly. .  Family Medical/ Social History: Changes? No Patient Lives with: parents  MENTAL HEALTH: Mental Health Issues:   Depression and Anxiety  better on Lexapro with no recent concerns.   Allergies: No Known Allergies  Current Medications:  Current Outpatient Medications  Medication Instructions   acetone, urine, test strip Check ketones per protocol   albuterol (VENTOLIN HFA) 108 (90 Base) MCG/ACT inhaler 1-2 inhalations every 4-6 hours as needed for wheezing. Dispense spacer as needed.   Amphetamine Sulfate (EVEKEO) 10 MG TABS Take 1 to 2 tablets by mouth daily.   BLISOVI FE 1/20 1-20 MG-MCG tablet TAKE 1 TABLET DAILY   Calcium-Vitamin D-Vitamin K 945-038-88 MG-UNT-MCG CHEW Chew by mouth.   cetirizine (ZYRTEC) 10 mg, As needed   Continuous Blood Gluc Sensor (DEXCOM G6 SENSOR) MISC Change sensor every 10 days   Continuous Blood Gluc Transmit (DEXCOM G6 TRANSMITTER) MISC CHANGE EVERY 3 MONTHS   escitalopram (LEXAPRO) 10 mg, Oral, Daily   fluticasone (FLONASE) 50 MCG/ACT nasal spray 2 sprays in each nostril daily x1 week then 1-2 sprays in each nostril daily. (use smallest dose possible for symptom control after week 1).   Glucagon (BAQSIMI TWO PACK) 3 MG/DOSE POWD 1 application., Nasal, As needed, Use as directed if unconscious, unable to take food po, or having a seizure due to hypoglycemia   insulin aspart (NOVOLOG FLEXPEN) 100 UNIT/ML FlexPen Inject as directed by MD, total daily dose up to 50 units. In case of pump failure.   insulin aspart (NOVOLOG) 100 UNIT/ML injection 300 units in  Insulin Pump every 48 hours  insulin aspart (NOVOLOG) 100 UNIT/ML injection Inject 300 units in insulin pump every 48-72 hours   levothyroxine (SYNTHROID) 137 MCG tablet 1 tablet in the morning on an empty stomach   Multiple Vitamin (MULTIVITAMIN) tablet 1 tablet, Daily   PROAIR HFA 108 (90 Base) MCG/ACT inhaler     rosuvastatin (CRESTOR) 10 MG tablet TAKE 1 TABLET BY MOUTH DAILY   Synthroid 137 mcg, Oral, Daily, Brand name medically necessary   Medication Side Effects: None  DIAGNOSES:    ICD-10-CM   1. Attention deficit hyperactivity disorder (ADHD), predominantly inattentive type  F90.0     2. Generalized anxiety disorder  F41.1     3. Goiter  E04.9     4. Hashimoto's thyroiditis  E06.3     5. Celiac disease  K90.0     6. Problems with learning  F81.9     7. Dysgraphia  R27.8     8. Depression, unspecified depression type  F32.A     9. Hyperlipidemia type II  E78.01     10. History of diabetes mellitus, type I  Z86.39     11. Medication management  Z79.899     12. Patient counseled  Z71.9     13. Goals of care, counseling/discussion  Z71.89      ASSESSMENT:     Kiara Greer is a 21 year old female with a history of ADHD, Anxiety, Depression and learning difficulties. She has been maintained on Lexapro 5 mg daily and Evekeo 10 mg 1-2 tablets daily with no side effects and efficacy reported. [name] likes taking the medication together in the morning and feels that the Lexapro has helped but would like to try increasing the dose slightly. 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 Currently staying with her 2 part-time positions until finding a full time job. No changes in eating, sleeping or health. Recent updates with endocrinology for routine care. Will adjust her Lexapro and continue with Evekeo as previously.   PLAN/RECOMMENDATIONS:  Academics and recent graduation from college discussed with Kiara Greer.   Had good support services and learning help at school when needed.  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  Healthcare updates discussed with specialists and primary care with no significant changes.   Continuation of healthy eating and some activity for healthy  lifestyle.   Sleep schedule has been consistent with no changes and encouraged to keep at least 8 hours nighty.   Discussed medication management of her symptoms with change to Lexapro and requested to go up to the next dose.   Counseled medication pharmacokinetics, options, dosage, administration, desired effects, and possible side effects.   Lexapro 10 mg daily (adjusted from 5 mg from last Rx), # 90 with no RF's Evekeo 10 mg 1-2 tablets daily # 60 with no RF's.RX for above e-scribed and sent to pharmacy on record  [address]. [place] [address] Phone: [phone] Fax: [phone]  I discussed the assessment and treatment plan with the patient. The patient was provided an opportunity to ask questions and all were answered. The patient agreed with the plan and demonstrated an understanding of the instructions.   NEXT APPOINTMENT:  Visit date not found-3 months follow up Telehealth OK  The patient was advised to call back or seek an in-person evaluation if the symptoms worsen or if the condition fails to improve as  anticipated.   Carolann Littler, NP

## 2021-08-22 NOTE — Telephone Encounter (Signed)
Outcome Approvedtoday Effective from 08/22/2021 through 08/21/2022.

## 2021-08-24 ENCOUNTER — Other Ambulatory Visit (HOSPITAL_COMMUNITY): Payer: Self-pay

## 2021-08-26 ENCOUNTER — Other Ambulatory Visit (HOSPITAL_COMMUNITY): Payer: Self-pay

## 2021-08-30 DIAGNOSIS — E1065 Type 1 diabetes mellitus with hyperglycemia: Secondary | ICD-10-CM | POA: Diagnosis not present

## 2021-09-01 ENCOUNTER — Other Ambulatory Visit (INDEPENDENT_AMBULATORY_CARE_PROVIDER_SITE_OTHER): Payer: Self-pay | Admitting: Pediatrics

## 2021-09-01 DIAGNOSIS — E1069 Type 1 diabetes mellitus with other specified complication: Secondary | ICD-10-CM

## 2021-09-19 ENCOUNTER — Other Ambulatory Visit (INDEPENDENT_AMBULATORY_CARE_PROVIDER_SITE_OTHER): Payer: Self-pay | Admitting: Pediatrics

## 2021-09-19 DIAGNOSIS — E063 Autoimmune thyroiditis: Secondary | ICD-10-CM

## 2021-10-11 ENCOUNTER — Other Ambulatory Visit (INDEPENDENT_AMBULATORY_CARE_PROVIDER_SITE_OTHER): Payer: Self-pay | Admitting: Pediatrics

## 2021-10-11 DIAGNOSIS — N921 Excessive and frequent menstruation with irregular cycle: Secondary | ICD-10-CM

## 2021-10-28 ENCOUNTER — Encounter: Payer: Self-pay | Admitting: Family

## 2021-10-28 ENCOUNTER — Encounter (INDEPENDENT_AMBULATORY_CARE_PROVIDER_SITE_OTHER): Payer: Self-pay | Admitting: Pediatrics

## 2021-11-02 ENCOUNTER — Encounter: Payer: Self-pay | Admitting: Family

## 2021-11-02 ENCOUNTER — Other Ambulatory Visit (HOSPITAL_COMMUNITY): Payer: Self-pay

## 2021-11-02 ENCOUNTER — Telehealth (INDEPENDENT_AMBULATORY_CARE_PROVIDER_SITE_OTHER): Payer: BC Managed Care – PPO | Admitting: Family

## 2021-11-02 DIAGNOSIS — F900 Attention-deficit hyperactivity disorder, predominantly inattentive type: Secondary | ICD-10-CM

## 2021-11-02 DIAGNOSIS — E109 Type 1 diabetes mellitus without complications: Secondary | ICD-10-CM | POA: Insufficient documentation

## 2021-11-02 DIAGNOSIS — Z8659 Personal history of other mental and behavioral disorders: Secondary | ICD-10-CM

## 2021-11-02 DIAGNOSIS — Z7189 Other specified counseling: Secondary | ICD-10-CM

## 2021-11-02 DIAGNOSIS — Z79899 Other long term (current) drug therapy: Secondary | ICD-10-CM

## 2021-11-02 DIAGNOSIS — Z719 Counseling, unspecified: Secondary | ICD-10-CM

## 2021-11-02 DIAGNOSIS — E1010 Type 1 diabetes mellitus with ketoacidosis without coma: Secondary | ICD-10-CM

## 2021-11-02 DIAGNOSIS — F411 Generalized anxiety disorder: Secondary | ICD-10-CM

## 2021-11-02 DIAGNOSIS — E7801 Familial hypercholesterolemia: Secondary | ICD-10-CM

## 2021-11-02 DIAGNOSIS — E063 Autoimmune thyroiditis: Secondary | ICD-10-CM

## 2021-11-02 DIAGNOSIS — E78019 Familial hypercholesterolemia, unspecified: Secondary | ICD-10-CM

## 2021-11-02 DIAGNOSIS — E049 Nontoxic goiter, unspecified: Secondary | ICD-10-CM

## 2021-11-02 DIAGNOSIS — K900 Celiac disease: Secondary | ICD-10-CM

## 2021-11-02 MED ORDER — ESCITALOPRAM OXALATE 10 MG PO TABS
10.0000 mg | ORAL_TABLET | Freq: Every day | ORAL | 0 refills | Status: DC
  Filled 2021-11-02: qty 90, 90d supply, fill #0

## 2021-11-02 MED ORDER — AMPHETAMINE SULFATE 10 MG PO TABS
10.0000 mg | ORAL_TABLET | Freq: Every day | ORAL | 0 refills | Status: DC
  Filled 2021-11-02: qty 60, 30d supply, fill #0

## 2021-11-02 NOTE — Progress Notes (Signed)
[facility] [place] [address] Dept: [phone] Dept Fax: [phone]  Medication Check visit via Virtual Video   Patient ID:  Kiara Greer  female DOB: June 03, 2000   21 y.o.   MRN: [id number]   DATE:11/02/21  PCP: Kandace Blitz, MD  Virtual Visit via Video Note  I connected with  Kiara Greer on 11/02/21 at  8:00 AM EDT by a video enabled telemedicine application and verified that I am speaking with the correct person using two identifiers. Patient/Parent Location: home  I discussed the limitations, risks, security and privacy concerns of performing an evaluation and management service by telephone and the availability of in person appointments. I also discussed with the parents that there may be a patient responsible charge related to this service. The parents expressed understanding and agreed to proceed.  Provider: Carolann Littler, NP  Location: work location  HPI/CURRENT STATUS: Kiara Greer is here for medication management of the psychoactive medications for ADHD and review of educational and behavioral concerns.   Kiara Greer currently taking Lexapro and Evekeo, which is working well. Takes medication as directed daily. Medication tends to wear off around  evening time. Kiara Greer is able to focus through homework.   Kiara Greer is eating well (eating breakfast, lunch and dinner). Kiara Greer does not have appetite suppression with no changes reported.   Sleeping well (getting enough sleep each night), sleeping through the night. Kiara Greer has some delayed sleep onset and nothing over the counter.   EDUCATION/WORK: Work: Remote work for [employer] Part time position Activities/ Exercise: intermittently  MEDICAL HISTORY: Individual Medical History/ Review of Systems: None reported  Has been healthy with no visits to the PCP. [place] due yearly.   Family Medical/ Social History: None   Patient Lives with: parents  MENTAL HEALTH: Mental Health Issues:   Depression and Anxiety with Lexapro for symptoms control. No counseling currently.   Allergies: No Known Allergies  Current Medications:  Current Outpatient Medications  Medication Instructions   acetone, urine, test strip Check ketones per protocol   albuterol (VENTOLIN HFA) 108 (90 Base) MCG/ACT inhaler 1-2 inhalations every 4-6 hours as needed for wheezing. Dispense spacer as needed.   Amphetamine Sulfate (EVEKEO) 10 MG TABS Take 1 to 2 tablets by mouth daily.   Calcium-Vitamin D-Vitamin K [id number] MG-UNT-MCG CHEW Chew by mouth.   cetirizine (ZYRTEC) 10 mg, As needed   Continuous Blood Gluc Sensor (DEXCOM G6 SENSOR) MISC Change sensor every 10 days   Continuous Blood Gluc Transmit (DEXCOM G6 TRANSMITTER) MISC CHANGE EVERY 3 MONTHS   escitalopram (LEXAPRO) 10 mg, Oral, Daily   fluticasone (FLONASE) 50 MCG/ACT nasal spray 2 sprays in each nostril daily x1 week then 1-2 sprays in each nostril daily. (use smallest dose possible for symptom control after week 1).   Glucagon (BAQSIMI TWO PACK) 3 MG/DOSE POWD 1 application , Nasal, As needed, Use as directed if unconscious, unable to take food po, or having a seizure due to hypoglycemia   insulin aspart (NOVOLOG FLEXPEN) 100 UNIT/ML FlexPen Inject as directed by MD, total daily dose up to 50 units. In case of pump failure.   insulin aspart (NOVOLOG) 100 UNIT/ML injection 300 units in  Insulin Pump every 48 hours   insulin aspart (NOVOLOG) 100 UNIT/ML injection Inject 300 units in insulin pump every 48-72 hours   levothyroxine (SYNTHROID) 137 MCG tablet 1 tablet in the morning on an empty stomach   Multiple Vitamin (  MULTIVITAMIN) tablet 1 tablet, Daily   norethindrone-ethinyl estradiol-FE (BLISOVI FE 1/20) 1-20 MG-MCG tablet 1 tablet, Oral, Daily   rosuvastatin (CRESTOR) 10 MG tablet TAKE 1 TABLET DAILY   SYNTHROID 137 MCG tablet TAKE 1 TABLET DAILY   Medication Side  Effects: None  DIAGNOSES:    ICD-10-CM   1. Attention deficit hyperactivity disorder (ADHD), predominantly inattentive type  F90.0     2. Generalized anxiety disorder  F41.1     3. History of depression  Z86.59     4. Hyperlipidemia type II  E78.01     5. 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 Diabetic ketoacidosis without coma associated with type 1 diabetes mellitus (HCC)  E10.10     7. Goiter  E04.9     8. Hashimoto's thyroiditis  E06.3     9. Celiac disease  K90.0     10. Patient counseled  Z71.9     11. Medication management  Z79.899     12. Goals of care, counseling/discussion  Z71.89      ASSESSMENT:    Kiara Greer is a 21 year old female with a history of ADHD, Anxiety, and L/D. Has been well maintained on Evekeo and Lexapro with efficacy and no side effects. Patient working remotely for [employer] part time, but looking for other opportunities. No difficulties performing tasks or duties required from her job. Staying active with travel and some walking in the evening time. NO changes with health and has scheduled specialists this month for routine f/u care. Eating with no concerns or changes in the past few months. Sleeping with some initiation issues more often when not physically active. No changes to her medication regimen.  PLAN/RECOMMENDATIONS:  Updates for work discussed with support at current job position and alternatives in the future.   Travel and activity encouraged for continued social interactions with working from home.  Suggested more physical activity on a regular basis due to sleep initiation problems with lack of exercise.  Healthy eating and monitoring her daily intake support with her DM.   Specialists f/u this month for ongoing care of her healthcare needs reviewed.   Sleep habits and initiation problems discussed with possible OTC use of melatonin with instructions for use, PRN.   Medication management discussed with patient  for current dose and medications with efficacy.   Counseled medication pharmacokinetics, options, dosage, administration, desired effects, and possible side effects.   Lexapro 10 mg daily, # 90 with no RF's Evekeo 10 mg 1-2 daily, # 60 with no RF's.RX for above e-scribed and sent to pharmacy on record  [place]. [place] [address] Phone: [phone] Fax: [phone]   I discussed the assessment and treatment plan with the patient. The patient was provided an opportunity to ask questions and all were answered. The patient agreed with the plan and demonstrated an understanding of the instructions.   NEXT APPOINTMENT:  02/01/2022-f/u visit  Telehealth OK  The patient was advised to call back or seek an in-person evaluation if the symptoms worsen or if the condition fails to improve as anticipated.  Carolann Littler, NP

## 2021-11-08 ENCOUNTER — Other Ambulatory Visit (INDEPENDENT_AMBULATORY_CARE_PROVIDER_SITE_OTHER): Payer: Self-pay | Admitting: Pediatrics

## 2021-11-08 DIAGNOSIS — E1069 Type 1 diabetes mellitus with other specified complication: Secondary | ICD-10-CM

## 2021-11-16 DIAGNOSIS — Z0000 Encounter for general adult medical examination without abnormal findings: Secondary | ICD-10-CM | POA: Diagnosis not present

## 2021-11-21 DIAGNOSIS — E1065 Type 1 diabetes mellitus with hyperglycemia: Secondary | ICD-10-CM | POA: Diagnosis not present

## 2021-11-22 LAB — MICROALBUMIN / CREATININE URINE RATIO
Creatinine, Urine: 31 mg/dL (ref 20–275)
Microalb Creat Ratio: 19 mcg/mg creat (ref <30)
Microalb, Ur: 0.6 mg/dL

## 2021-11-22 LAB — TSH: TSH: 5.6 mIU/L — ABNORMAL HIGH

## 2021-11-22 LAB — LIPID PANEL
Cholesterol: 222 mg/dL — ABNORMAL HIGH (ref <200)
HDL: 73 mg/dL (ref >=50)
LDL Cholesterol (Calc): 131 mg/dL (calc) — ABNORMAL HIGH
Non-HDL Cholesterol (Calc): 149 mg/dL (calc) — ABNORMAL HIGH (ref <130)
Total CHOL/HDL Ratio: 3 (calc) (ref <5.0)
Triglycerides: 79 mg/dL (ref <150)

## 2021-11-22 LAB — T4, FREE: Free T4: 0.9 ng/dL (ref 0.8–1.8)

## 2021-11-23 ENCOUNTER — Ambulatory Visit (INDEPENDENT_AMBULATORY_CARE_PROVIDER_SITE_OTHER): Payer: BC Managed Care – PPO | Admitting: Pediatrics

## 2021-11-23 ENCOUNTER — Encounter (INDEPENDENT_AMBULATORY_CARE_PROVIDER_SITE_OTHER): Payer: Self-pay | Admitting: Pediatrics

## 2021-11-23 VITALS — BP 122/74 | HR 84 | Wt 146.8 lb

## 2021-11-23 DIAGNOSIS — E1069 Type 1 diabetes mellitus with other specified complication: Secondary | ICD-10-CM

## 2021-11-23 DIAGNOSIS — E063 Autoimmune thyroiditis: Secondary | ICD-10-CM | POA: Diagnosis not present

## 2021-11-23 DIAGNOSIS — Z9641 Presence of insulin pump (external) (internal): Secondary | ICD-10-CM | POA: Diagnosis not present

## 2021-11-23 DIAGNOSIS — E1065 Type 1 diabetes mellitus with hyperglycemia: Secondary | ICD-10-CM

## 2021-11-23 DIAGNOSIS — K900 Celiac disease: Secondary | ICD-10-CM

## 2021-11-23 DIAGNOSIS — N921 Excessive and frequent menstruation with irregular cycle: Secondary | ICD-10-CM

## 2021-11-23 DIAGNOSIS — E785 Hyperlipidemia, unspecified: Secondary | ICD-10-CM

## 2021-11-23 LAB — POCT GLYCOSYLATED HEMOGLOBIN (HGB A1C): HbA1c POC (<> result, manual entry): 11.1 % (ref 4.0–5.6)

## 2021-11-23 LAB — POCT GLUCOSE (DEVICE FOR HOME USE): POC Glucose: 270 mg/dl — AB (ref 70–99)

## 2021-11-23 NOTE — Patient Instructions (Signed)
It was a pleasure to see you in clinic today.   Feel free to contact our office during normal business hours at [phone] with questions or concerns. If you have an emergency after normal business hours, please call the above number to reach our answering service who will contact the on-call pediatric endocrinologist.  If you choose to communicate with [place] via [place], please do not send urgent messages as this inbox is NOT monitored on nights or weekends.  Urgent concerns should be discussed with the on-call pediatric endocrinologist.  -Always have fast sugar with you in case of low blood sugar (glucose tabs, regular juice or soda, candy) -Always wear your ID that states you have diabetes -Always bring your meter/continuous glucose monitor to your visit -Call/Email if you want to review blood sugars

## 2021-11-23 NOTE — Progress Notes (Unsigned)
Pediatric Endocrinology Diabetes Consultation Follow-up Visit  Kiara Greer 2000/07/16 [id number]  Chief Complaint: Follow-up type 1 diabetes, acquired hypothyroidism, celiac disease, abnormal lipids, menorrhagia  Kiara Blitz, MD   HPI: Kiara Greer is a 21 y.o. female presenting for follow-up of the above concerns.  she attended this visit alone.  75. Kiara Greer was diagnosed with type 1 diabetes mellitus at age 19 months in 2002; she was not in DKA at diagnosis.  She was initially followed at [facility] pediatric endocrinology. She was started on an insulin pump one week after diagnosis.  She most recently upgraded to a medtronic 530G pump in Summer 2016.  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 Celiac disease was diagnosed in 2008. She was referred to our clinic on 01/14/09 at age [age] years.  Her mother and younger sister, Kiara Greer also had T1DM and celiac disease. Mother was also hypothyroid then. Sister Kiara Greer was initially euthyroid, but developed hypothyroidism in 2012.  She started using a dexcom CGM in 04/2016. She transitioned to a Tslim pump in 11/2018.  2. Since last visit on 07/21/21, she has been well.   ED visits/Hospitalizations: No   Concerns:  -None   Insulin regimen: Tslim settings     CGM:   CGM interpretation: Needs to bolus before meals  Hypoglycemia: Not many.  Able to feel low blood sugars.  No glucagon needed recently.  Wearing Med-alert ID currently: No.  Reminded to wear Injection sites: CGM on arms, pump site on butt/abd Annual labs due: Drawn 11/21/21  Ophthalmology due: 02/2022 (visit 02/2021 showed no retinopathy)  Acquired hypothyroidism: She takes synthroid 170mg daily (increased 10/2017).   Missing doses: yes, not taking consistently  Energy level: good Sleep: not great, no changes Periods regular: yes, on OCPs below  Most recent TFTs from 11/2021 showed mild elevation in TSH to 5.6 with low  normal FT4 of 0.9  Celiac disease: Continues on a gluten free diet 100% of the time. Weight changes: Weight has increased 7lb since last visit.   Hyperlipidemia: She continues on crestor 172mdaily (increased in 10/2018) and a plant based sterol called cholest-off.  Lipid panel drawn 11/2021 remarkable for elevated total cholesterol to 222 and elevated LDL to 131. Has not been consistently taking crestor.  Menorrhagia with irregular cycles:  Started on OCPs in 12/2016 for menorrhagia with irregular cycles with improvement in menses though these were stopped in 03/2017 due to concerns they were contributing to depression.  Menses resumed very heavy so she was restarted on OCPs in 07/2017 without change in depression.   Continues on Junel Fe 1/20 Withdrawal bleeding during week of inactive pills: yes.  No bad cramps Spotting: yes, not taking consistently so having more spotting   ROS: All systems reviewed with pertinent positives listed below; otherwise negative.   Past Medical History:   Past Medical History:  Diagnosis Date   ADHD (attention deficit hyperactivity disorder)    Celiac disease    Diabetes mellitus type I ([facility]   Duanes syndrome    Goiter    Hashimoto's thyroiditis    Hyperlipidemia type II    Hypoglycemia associated with diabetes ([facility]   Hypoglycemia unawareness in type 1 diabetes mellitus (HCC)    Hypothyroidism, acquired, autoimmune     Medications:  Outpatient Encounter Medications as of 11/23/2021  Medication Sig Note   Amphetamine Sulfate (EVEKEO) 10 MG TABS Take 1 to 2 tablets by mouth daily.    Calcium-Vitamin D-Vitamin K  [phone] MG-UNT-MCG CHEW Chew by mouth.    cetirizine (ZYRTEC) 10 MG tablet Take 10 mg by mouth as needed for allergies.    Continuous Blood Gluc Sensor (DEXCOM G6 SENSOR) MISC CHANGE SENSOR EVERY 10 DAYS    Continuous Blood Gluc Transmit (DEXCOM G6 TRANSMITTER) MISC CHANGE EVERY 3 MONTHS    escitalopram (LEXAPRO) 10 MG tablet Take 1  tablet (10 mg total) by mouth daily.    Glucagon (BAQSIMI TWO PACK) 3 MG/DOSE POWD Place 1 application into the nose as needed. Use as directed if unconscious, unable to take food po, or having a seizure due to hypoglycemia    insulin aspart (NOVOLOG FLEXPEN) 100 UNIT/ML FlexPen Inject as directed by MD, total daily dose up to 50 units. In case of pump failure.    insulin aspart (NOVOLOG) 100 UNIT/ML injection 300 units in  Insulin Pump every 48 hours    insulin aspart (NOVOLOG) 100 UNIT/ML injection Inject 300 units in insulin pump every 48-72 hours    Multiple Vitamin (MULTIVITAMIN) tablet Take 1 tablet by mouth daily.    norethindrone-ethinyl estradiol-FE (BLISOVI FE 1/20) 1-20 MG-MCG tablet Take 1 tablet by mouth daily.    rosuvastatin (CRESTOR) 10 MG tablet TAKE 1 TABLET DAILY    SYNTHROID 137 MCG tablet TAKE 1 TABLET DAILY    acetone, urine, test strip Check ketones per protocol (Patient not taking: Reported on 09/02/2020)    albuterol (VENTOLIN HFA) 108 (90 Base) MCG/ACT inhaler 1-2 inhalations every 4-6 hours as needed for wheezing. Dispense spacer as needed. 10/10/2018: Keeps for PRN use   fluticasone (FLONASE) 50 MCG/ACT nasal spray 2 sprays in each nostril daily x1 week then 1-2 sprays in each nostril daily. (use smallest dose possible for symptom control after week 1).    levothyroxine (SYNTHROID) 137 MCG tablet 1 tablet in the morning on an empty stomach (Patient not taking: Reported on 11/23/2021)    No facility-administered encounter medications on file as of 11/23/2021.  Taking an OTC tablet called cholest-off for hyperlipidemia (contains plant sterols)  Allergies: No Known Allergies  Surgical History: Past Surgical History:  Procedure Laterality Date   NO PAST SURGERIES      Family History:  Family History  Problem Relation Age of Onset   Diabetes Mother    Thyroid disease Mother    Celiac disease Mother    Diabetes Sister    Thyroid disease Sister    Celiac disease  Sister    Diabetes Maternal Uncle    Thyroid disease Maternal Grandmother    Celiac disease Maternal Grandmother    Cancer Neg Hx     Social History: Graduated from [employer] Working from home currently  Physical Exam:  Vitals:   11/23/21 1358  BP: 122/74  Pulse: 84  Weight: 146 lb 12.8 oz (66.6 kg)    BP 122/74 (BP Location: Left Arm, Patient Position: Sitting, Cuff Size: Large)   Pulse 84   Wt 146 lb 12.8 oz (66.6 kg)   LMP 11/01/2021 (Approximate)   BMI 24.43 kg/m  Body mass index: body mass index is 24.43 kg/m. Growth %ile SmartLinks can only be used for patients less than 97 years old.  Ht Readings from Last 3 Encounters:  09/02/20 5' 5.24" (1.657 m)  07/10/19 5' 5"  (1.651 m)  01/31/18 5' 4.57" (1.64 m) (55 %, Z= 0.12)*   * Growth percentiles are based on CDC (Girls, 2-20 Years) data.   Wt Readings from Last 3 Encounters:  11/23/21 146 lb 12.8 oz (66.6  kg)  07/21/21 139 lb 12.8 oz (63.4 kg)  12/28/20 147 lb 4 oz (66.8 kg)   General: Well developed, well nourished female in no acute distress.  Appears stated age Head: Normocephalic, atraumatic.   Eyes:  Pupils equal and round.  Sclera white.  No eye drainage.   Ears/Nose/Mouth/Throat: Nares patent, no nasal drainage.  Moist mucous membranes, normal dentition Neck: supple, no cervical lymphadenopathy, no thyromegaly Cardiovascular: regular rate, normal S1/S2, no murmurs Respiratory: No increased work of breathing.  Lungs clear to auscultation bilaterally.  No wheezes. Abdomen: soft, nontender, nondistended.  Extremities: warm, well perfused, cap refill < 2 sec.   Musculoskeletal: Normal muscle mass.  Normal strength Skin: warm, dry.  No rash or lesions. Neurologic: alert and oriented, normal speech, no tremor   Labs: Hemoglobin A1c trend: 9.3%-->10.7%-->10.2% -->9.3%-->8.6%-->8.3% 12/2016-->11.2% 04/2017-->9.5% 07/2017-->8.9% 10/2017-->11% 01/2018, 11.4% 10/2018, 11.1% 03/2020, 8.2% 09/2020, 7.7% 12/2020-->7.9% 07/2021-->  11.1% 11/2021   Results for orders placed or performed in visit on 11/23/21  POCT glycosylated hemoglobin (Hb A1C)  Result Value Ref Range   Hemoglobin A1C     HbA1c POC (<> result, manual entry) 11.1 4.0 - 5.6 %   HbA1c, POC (prediabetic range)     HbA1c, POC (controlled diabetic range)    POCT Glucose (Device for Home Use)  Result Value Ref Range   Glucose Fasting, POC     POC Glucose 270 (A) 70 - 99 mg/dl   Results for orders placed or performed in visit on 11/23/21  POCT glycosylated hemoglobin (Hb A1C)  Result Value Ref Range   Hemoglobin A1C     HbA1c POC (<> result, manual entry) 11.1 4.0 - 5.6 %   HbA1c, POC (prediabetic range)     HbA1c, POC (controlled diabetic range)    POCT Glucose (Device for Home Use)  Result Value Ref Range   Glucose Fasting, POC     POC Glucose 270 (A) 70 - 99 mg/dl    Latest Reference Range & Units 11/21/21 10:27  Total CHOL/HDL Ratio <5.0 (calc) 3.0  Cholesterol <200 mg/dL 222 (H)  HDL Cholesterol > OR = 50 mg/dL 73  LDL Cholesterol (Calc) mg/dL (calc) 131 (H)  MICROALB/CREAT RATIO <30 mcg/mg creat 19  Non-HDL Cholesterol (Calc) <130 mg/dL (calc) 149 (H)  Triglycerides <150 mg/dL 79  TSH mIU/L 5.60 (H)  T4,Free(Direct) 0.8 - 1.8 ng/dL 0.9  Microalb, Ur mg/dL 0.6  Creatinine, Urine 20 - 275 mg/dL 31  (H): Data is abnormally high  Assessment/Plan: Amariona Rathje is a 21 y.o. female with T1DM on a pump (Tslim) and CGM regimen.   A1c is elevated and is above the ADA goal of <7.0%.  Dexcom tracing shows she is *not meeting goal of TIR >70%. she needs to bolus before eating.   She continues on synthroid for hypothyroidism. She is clinically euthyroid and biochemically hypothyroid (not taking synthroid consistently). She continues to follow a gluten-free diet for celiac disease and has no GI symptoms.  She also continues on rosuvastatin for hyperlipidemia (though is not taking consistently).  She is doing well on low dose OCP.  When a patient  is on insulin, intensive monitoring of blood glucose levels and continuous insulin titration is vital to avoid insulin toxicity leading to severe hypoglycemia. Severe hypoglycemia can lead to seizure or death. Hyperglycemia can also result from inadequate insulin dosing and can lead to ketosis requiring ICU admission and intravenous insulin.   1. Type 1 diabetes with hyperglycemia - POCT Glucose and POCT HgB  A1C as above -Encouraged to wear med alert ID every day -Encouraged to rotate injection sites -Provided with my contact information and advised to email/send mychart with questions/need for BG review -CGM download reviewed extensively (see interpretation above) -Having difficulty getting appt with [facility].  Cannot be seen until Feb 2024.  Advised that I will see her again if she cannot get into [facility] earlier.  2.  Insulin Pump in [place] No pump changes Encouraged to bolus for meals  3. Acquired autoimmune hypothyroidism -Continue current levothyroxine. Needs to take more consistently  4. Celiac disease -Continue gluten free diet  5. Hyperlipidemia due to type 1 diabetes mellitus (HCC) -Continue rosuvastatin.  Needs to take more consistently  6. Menorrhagia with irregular cycle -Continue current OCP  Follow-up:   Return in about 4 months (around 03/25/2022).   >30 minutes spent today reviewing the medical chart, counseling the patient/family, and documenting today's encounter.   Levon Hedger, MD

## 2021-11-24 ENCOUNTER — Encounter (INDEPENDENT_AMBULATORY_CARE_PROVIDER_SITE_OTHER): Payer: Self-pay | Admitting: Pediatrics

## 2021-12-03 DIAGNOSIS — Z794 Long term (current) use of insulin: Secondary | ICD-10-CM | POA: Diagnosis not present

## 2022-01-02 ENCOUNTER — Other Ambulatory Visit: Payer: Self-pay

## 2022-01-02 ENCOUNTER — Other Ambulatory Visit (HOSPITAL_COMMUNITY): Payer: Self-pay

## 2022-01-02 MED ORDER — ESCITALOPRAM OXALATE 10 MG PO TABS
10.0000 mg | ORAL_TABLET | Freq: Every day | ORAL | 0 refills | Status: DC
  Filled 2022-01-02: qty 90, 90d supply, fill #0

## 2022-01-02 MED ORDER — AMPHETAMINE SULFATE 10 MG PO TABS
10.0000 mg | ORAL_TABLET | Freq: Every day | ORAL | 0 refills | Status: DC
  Filled 2022-01-02: qty 60, 30d supply, fill #0

## 2022-01-02 NOTE — Telephone Encounter (Signed)
Evekeo 10 mg 1-2 daily, # 60 with no RF"s and Lexapro 10 mg daily # 90 with no RF's.RX for above e-scribed and sent to pharmacy on record  [place] [address] Phone: [phone] Fax: [phone]

## 2022-02-01 ENCOUNTER — Encounter: Payer: Self-pay | Admitting: Family

## 2022-02-01 ENCOUNTER — Ambulatory Visit: Payer: BC Managed Care – PPO | Admitting: Family

## 2022-02-01 VITALS — BP 110/68 | HR 72 | Resp 16 | Ht 65.0 in | Wt 149.6 lb

## 2022-02-01 DIAGNOSIS — F900 Attention-deficit hyperactivity disorder, predominantly inattentive type: Secondary | ICD-10-CM | POA: Diagnosis not present

## 2022-02-01 DIAGNOSIS — Z79899 Other long term (current) drug therapy: Secondary | ICD-10-CM

## 2022-02-01 DIAGNOSIS — Z7189 Other specified counseling: Secondary | ICD-10-CM

## 2022-02-01 DIAGNOSIS — F411 Generalized anxiety disorder: Secondary | ICD-10-CM

## 2022-02-01 DIAGNOSIS — K900 Celiac disease: Secondary | ICD-10-CM

## 2022-02-01 DIAGNOSIS — Z8659 Personal history of other mental and behavioral disorders: Secondary | ICD-10-CM

## 2022-02-01 DIAGNOSIS — E063 Autoimmune thyroiditis: Secondary | ICD-10-CM

## 2022-02-01 DIAGNOSIS — E11649 Type 2 diabetes mellitus with hypoglycemia without coma: Secondary | ICD-10-CM

## 2022-02-01 DIAGNOSIS — Z719 Counseling, unspecified: Secondary | ICD-10-CM

## 2022-02-01 DIAGNOSIS — E109 Type 1 diabetes mellitus without complications: Secondary | ICD-10-CM

## 2022-02-01 DIAGNOSIS — Z8639 Personal history of other endocrine, nutritional and metabolic disease: Secondary | ICD-10-CM

## 2022-02-01 NOTE — Progress Notes (Signed)
[facility] [address], [address] [place] [address] Dept: [phone] Dept Fax: [phone] Loc: [phone] Loc Fax: [phone]  Medication Check  Patient ID: Kiara Greer, female  DOB: [birth year], 21 y.o.  MRN: [id number]  Date of Evaluation: 02/01/2022 PCP: Kandace Blitz, MD  Accompanied by:  self Patient Lives with:  parents  HISTORY/CURRENT STATUS: HPI Patient here by herself today for the visit. Patient interactive and appropriate with provider today.   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   Sleep: Getting about 8 hours/night Concerns: Initiation/Maintenance/Other: None   Individual Medical History/ Review of Systems: Changes? :Yes Endocrinology in August for routine f/u visit.   Allergies: Patient has no known allergies.  Current Medications:  Current Outpatient Medications  Medication Instructions   acetone, urine, test strip Check ketones per protocol   Amphetamine Sulfate (EVEKEO) 10 MG TABS Take 1 to 2 tablets by mouth daily.   Calcium-Vitamin D-Vitamin K 500-100-40 MG-UNT-MCG CHEW Oral,     cetirizine (ZYRTEC) 10 mg, Oral, As needed   Continuous Blood Gluc Sensor (DEXCOM G6 SENSOR) MISC CHANGE SENSOR EVERY 10 DAYS   Continuous Blood Gluc Transmit (DEXCOM G6 TRANSMITTER) MISC CHANGE EVERY 3 MONTHS   escitalopram (LEXAPRO) 10 mg, Oral, Daily   Glucagon (BAQSIMI TWO PACK) 3 MG/DOSE POWD 1 application , Nasal, As needed, Use as directed if unconscious, unable to take food po, or having a seizure due to hypoglycemia   insulin aspart (NOVOLOG FLEXPEN) 100 UNIT/ML FlexPen Inject as directed by MD, total daily dose up to 50 units. In case of  pump failure.   insulin aspart (NOVOLOG) 100 UNIT/ML injection 300 units in  Insulin Pump every 48 hours   insulin aspart (NOVOLOG) 100 UNIT/ML injection Inject 300 units in insulin pump every 48-72 hours   levothyroxine (SYNTHROID) 137 MCG tablet 1 tablet in the morning on an empty stomach   Multiple Vitamin (MULTIVITAMIN) tablet 1 tablet, Oral, Daily,     norethindrone-ethinyl estradiol-FE (BLISOVI FE 1/20) 1-20 MG-MCG tablet 1 tablet, Oral, Daily   rosuvastatin (CRESTOR) 10 MG tablet TAKE 1 TABLET DAILY   SYNTHROID 137 MCG tablet TAKE 1 TABLET DAILY   Medication Side Effects: None Family Medical/ Social History: Changes? None  MENTAL HEALTH: Mental Health Issues: Depression and Anxiety-Lexapro with good symptom control.   PHYSICAL EXAM; Vitals:  General Physical Exam: Unchanged from previous exam, date: 11/02/2021 Changed:None  DIAGNOSES:    ICD-10-CM   1. Attention deficit hyperactivity disorder (ADHD), predominantly inattentive type  F90.0     2. Generalized anxiety disorder  F41.1     3. Celiac disease  K90.0     4. Type 1 diabetes mellitus without complication (HCC)  P54.6     5. Hypoglycemia associated with diabetes ([place])  E11.649     6. Hashimoto's thyroiditis  E06.3     7. History of depression  Z86.59     8. History of diabetes mellitus, type I  Z86.39     9. Medication management  Z79.899     10. Patient counseled  Z71.9     11. Goals of care, counseling/discussion  Z71.89      ASSESSMENT: Kiara Greer is a 21 year old female with a history of ADHD, L/D, Anxiety, and Depression. Has been well maintained  on Evekeo and Lexapro with good efficacy and no side effects. Having difficulty remembering her medications in the morning with lack of routine. 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 Making connections and networking for better options with work. Helping at home with various chores. Some activity with walking on occasion  and eating with no changes. Transitioning care from ped endocrine to adult endocrinology in April with last visit in August. No other reported health issues since last f/u visit. Sleeping on a schedule most nights with no issues reported. No changes needed with medication or dosing today.   RECOMMENDATIONS:  Updates with work and limited progress with job seeking discussed.  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  Suggested more physical activity on a regular basis for continued health.   Healthy eating habits discussed with daily monitoring for glucose.  Specialist f/u with peds endocrinology and transition to adult in April 2023.  Sleep habits and current sleep schedule discussed. No recent use of OTC medication for initiation.   Medication management discussed with Auna and current medications with no dose change.   Counseled medication pharmacokinetics, options, dosage, administration, desired effects, and possible side effects. Lexparo 10 mg no Rx today Evekeo 10 mg 1-2 daily, no Rx today  I discussed the assessment and treatment plan with the patient. The patient was provided an opportunity to ask questions and all were answered. The patient agreed with the plan and demonstrated an understanding of the instructions.  NEXT APPOINTMENT: Return in about 3 months (around 05/04/2022) for f/u visit.  The patient was advised to call back or seek an in-person evaluation if the symptoms worsen or if the condition fails to improve as anticipated.  Carolann Littler, NP   Counseling Time: 35 mins Total Contact Time: 38 mins

## 2022-02-28 DIAGNOSIS — Z794 Long term (current) use of insulin: Secondary | ICD-10-CM | POA: Diagnosis not present

## 2022-03-13 ENCOUNTER — Encounter (INDEPENDENT_AMBULATORY_CARE_PROVIDER_SITE_OTHER): Payer: Self-pay | Admitting: Pediatrics

## 2022-03-13 DIAGNOSIS — E1065 Type 1 diabetes mellitus with hyperglycemia: Secondary | ICD-10-CM

## 2022-03-14 MED ORDER — DEXCOM G7 SENSOR MISC: 3 refills | Status: DC

## 2022-03-23 ENCOUNTER — Ambulatory Visit (INDEPENDENT_AMBULATORY_CARE_PROVIDER_SITE_OTHER): Payer: BC Managed Care – PPO | Admitting: Pediatrics

## 2022-04-19 ENCOUNTER — Telehealth (INDEPENDENT_AMBULATORY_CARE_PROVIDER_SITE_OTHER): Payer: BC Managed Care – PPO | Admitting: Pediatrics

## 2022-04-19 ENCOUNTER — Encounter (INDEPENDENT_AMBULATORY_CARE_PROVIDER_SITE_OTHER): Payer: Self-pay | Admitting: Pediatrics

## 2022-04-19 DIAGNOSIS — E1069 Type 1 diabetes mellitus with other specified complication: Secondary | ICD-10-CM

## 2022-04-19 DIAGNOSIS — E1065 Type 1 diabetes mellitus with hyperglycemia: Secondary | ICD-10-CM

## 2022-04-19 DIAGNOSIS — K900 Celiac disease: Secondary | ICD-10-CM

## 2022-04-19 DIAGNOSIS — E063 Autoimmune thyroiditis: Secondary | ICD-10-CM

## 2022-04-19 DIAGNOSIS — Z9641 Presence of insulin pump (external) (internal): Secondary | ICD-10-CM

## 2022-04-19 DIAGNOSIS — E785 Hyperlipidemia, unspecified: Secondary | ICD-10-CM

## 2022-04-19 DIAGNOSIS — N921 Excessive and frequent menstruation with irregular cycle: Secondary | ICD-10-CM

## 2022-04-19 MED ORDER — LEVOTHYROXINE SODIUM 137 MCG PO TABS: ORAL_TABLET | ORAL | 3 refills | Status: DC

## 2022-04-19 MED ORDER — INSULIN ASPART 100 UNIT/ML IJ SOLN: INTRAMUSCULAR | 3 refills | Status: DC

## 2022-04-19 NOTE — Progress Notes (Signed)
This is a Pediatric Specialist E-Visit consult/follow up provided via My Chart Video Visit Raiana Pharris  consented to an E-Visit consult today.  Location of patient: Mallika is at Working in [place] Location of provider: Jerelene Redden, MD is at  [facility] Patient was referred by Kandace Blitz, MD   The following participants were involved in this E-Visit: [name], pt; Renato Gails, [place]; Jerelene Redden, MD  This visit was done via VIDEO   Chief Complain/ Reason for E-Visit today: Diabetes Total time on call: 10 min Follow up: transfer to adult endocrine

## 2022-04-19 NOTE — Progress Notes (Signed)
Pediatric Endocrinology Diabetes Consultation Follow-up Visit  Juliene Kirsh [birth year] [id number]  Chief Complaint: Follow-up type 1 diabetes, acquired hypothyroidism, celiac disease, abnormal lipids, menorrhagia  Santa Genera, MD   HPI: Kiara Greer is a 22 y.o. female presenting for follow-up of the above concerns.  she attended this video visit alone.  1. Markeda was diagnosed with type 1 diabetes mellitus at age 69 months in 2002; she was not in DKA at diagnosis.  She was initially followed at [facility] pediatric endocrinology. She was started on an insulin pump one week after diagnosis.  She most recently upgraded to a medtronic 530G pump in Summer 2016.  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 Celiac disease was diagnosed in 2008. She was referred to our clinic on 01/14/09 at age [age] years.  Her mother and younger sister, [name] also had T1DM and celiac disease. Mother was also hypothyroid then. Sister [name] was initially euthyroid, but developed hypothyroidism in 2012.  She started using a dexcom CGM in 04/2016. She transitioned to a Tslim pump in 11/2018.  2. Since last visit on 11/23/21, she has been well.   ED visits/Hospitalizations: No   Concerns:  -Doing fine.  No concerns related to diabetes.   Insulin regimen: Tslim settings     CGM:    CGM interpretation: needs to enter carbs (has been just entering a unit amount)  Hypoglycemia: No hypoglycemia per pump download.  Wearing Med-alert ID currently: No. Reminded to wear one at all times. Injection sites: No problems noted Annual labs due: Drawn 11/21/21; will draw in the next several weeks Ophthalmology due: 02/2022 (visit 02/2021 showed no retinopathy)- Not discussed today  Acquired hypothyroidism: She takes synthroid daily (increased 10/2017).   Missing doses: taking more consistently, only missing 2-3 doses per week Energy level: good Sleep: same as in  the past (not a great sleeper) Periods regular: yes, on OCPs below   Celiac disease: Continues on a gluten free diet 100% of the time. Weight changes: She denies large swings in weight. No GI symptoms.  Hyperlipidemia: She continues on crestor 10mg  daily (increased in 10/2018) and a plant based sterol called cholest-off.  Lipid panel drawn 11/2021 remarkable for elevated total cholesterol to 222 and elevated LDL to 131. No muscle pain, missing about 2-3 times per week   Menorrhagia with irregular cycles:  Started on OCPs in 12/2016 for menorrhagia with irregular cycles with improvement in menses though these were stopped in 03/2017 due to concerns they were contributing to depression.  Menses resumed very heavy so she was restarted on OCPs in 07/2017 without change in depression.   Continues on Junel Fe 1/20 Withdrawal bleeding during week of inactive pills: yes Spotting: No  ROS: All systems reviewed with pertinent positives listed below; otherwise negative.   Past Medical History:   Past Medical History:  Diagnosis Date   ADHD (attention deficit hyperactivity disorder)    Celiac disease    Diabetes mellitus type I (HCC)    Duanes syndrome    Goiter    Hashimoto's thyroiditis    Hyperlipidemia type II    Hypoglycemia associated with diabetes (HCC)    Hypoglycemia unawareness in type 1 diabetes mellitus (HCC)    Hypothyroidism, acquired, autoimmune     Medications:  Outpatient Encounter Medications as of 04/19/2022  Medication Sig   Amphetamine Sulfate (EVEKEO) 10 MG TABS Take 1 to 2 tablets by mouth daily.   cetirizine (ZYRTEC) 10 MG tablet Take 10  mg by mouth as needed for allergies.   Continuous Blood Gluc Sensor (DEXCOM G6 SENSOR) MISC CHANGE SENSOR EVERY 10 DAYS   Continuous Blood Gluc Transmit (DEXCOM G6 TRANSMITTER) MISC CHANGE EVERY 3 MONTHS   escitalopram (LEXAPRO) 10 MG tablet Take 1 tablet (10 mg total) by mouth daily.   Glucagon (BAQSIMI TWO PACK) 3 MG/DOSE POWD  Place 1 application into the nose as needed. Use as directed if unconscious, unable to take food po, or having a seizure due to hypoglycemia   insulin aspart (NOVOLOG FLEXPEN) 100 UNIT/ML FlexPen Inject as directed by MD, total daily dose up to 50 units. In case of pump failure.   insulin aspart (NOVOLOG) 100 UNIT/ML injection 300 units in  Insulin Pump every 48 hours   insulin aspart (NOVOLOG) 100 UNIT/ML injection Inject 300 units in insulin pump every 48-72 hours   Multiple Vitamin (MULTIVITAMIN) tablet Take 1 tablet by mouth daily.   norethindrone-ethinyl estradiol-FE (BLISOVI FE 1/20) 1-20 MG-MCG tablet Take 1 tablet by mouth daily.   rosuvastatin (CRESTOR) 10 MG tablet TAKE 1 TABLET DAILY   SYNTHROID 137 MCG tablet TAKE 1 TABLET DAILY   acetone, urine, test strip Check ketones per protocol (Patient not taking: Reported on 09/02/2020)   Calcium-Vitamin D-Vitamin K 500-100-40 MG-UNT-MCG CHEW Chew by mouth. (Patient not taking: Reported on 04/19/2022)   Continuous Blood Gluc Sensor (DEXCOM G7 SENSOR) MISC Place a new Dexcom G7 sensor every 10 days. (Patient not taking: Reported on 04/19/2022)   levothyroxine (SYNTHROID) 137 MCG tablet 1 tablet in the morning on an empty stomach (Patient not taking: Reported on 11/23/2021)   No facility-administered encounter medications on file as of 04/19/2022.  Taking an OTC tablet called cholest-off for hyperlipidemia (contains plant sterols)  Allergies: No Known Allergies  Surgical History: Past Surgical History:  Procedure Laterality Date   NO PAST SURGERIES      Family History:  Family History  Problem Relation Age of Onset   Diabetes Mother    Thyroid disease Mother    Celiac disease Mother    Diabetes Sister    Thyroid disease Sister    Celiac disease Sister    Diabetes Maternal Uncle    Thyroid disease Maternal Grandmother    Celiac disease Maternal Grandmother    Cancer Neg Hx     Social History: Graduated from [employer] Working from home  currently  Physical Exam:  There were no vitals filed for this visit.   There were no vitals taken for this visit. Body mass index: body mass index is unknown because there is no height or weight on file. Growth %ile SmartLinks can only be used for patients less than 62 years old.  Ht Readings from Last 3 Encounters:  09/02/20 5' 5.24" (1.657 m)  07/10/19 5\' 5"  (1.651 m)  01/31/18 5' 4.57" (1.64 m) (55 %, Z= 0.12)*   * Growth percentiles are based on CDC (Girls, 2-20 Years) data.   Wt Readings from Last 3 Encounters:  11/23/21 146 lb 12.8 oz (66.6 kg)  07/21/21 139 lb 12.8 oz (63.4 kg)  12/28/20 147 lb 4 oz (66.8 kg)   General: Well developed, well nourished female in no acute distress.  Appears stated age Head: Normocephalic, atraumatic.   Eyes:  Pupils equal and round. Sclera white.  No eye drainage.   Ears/Nose/Mouth/Throat: Nares patent, no nasal drainage.  Normal dentition. Neck: No obvious thyromegaly Cardiovascular: Well perfused, no cyanosis Respiratory: No increased work of breathing.  No cough. Extremities: Moving extremities  well.   Musculoskeletal: Normal muscle mass.  No deformity Skin: No rash or lesions. Neurologic: alert and oriented, normal speech  Labs: Hemoglobin A1c trend: 9.3%-->10.7%-->10.2% -->9.3%-->8.6%-->8.3% 12/2016-->11.2% 04/2017-->9.5% 07/2017-->8.9% 10/2017-->11% 01/2018, 11.4% 10/2018, 11.1% 03/2020, 8.2% 09/2020, 7.7% 12/2020-->7.9% 07/2021--> 11.1% 11/2021   Results for orders placed or performed in visit on 11/23/21  POCT glycosylated hemoglobin (Hb A1C)  Result Value Ref Range   Hemoglobin A1C     HbA1c POC (<> result, manual entry) 11.1 4.0 - 5.6 %   HbA1c, POC (prediabetic range)     HbA1c, POC (controlled diabetic range)    POCT Glucose (Device for Home Use)  Result Value Ref Range   Glucose Fasting, POC     POC Glucose 270 (A) 70 - 99 mg/dl   Results for orders placed or performed in visit on 11/23/21  POCT glycosylated hemoglobin (Hb  A1C)  Result Value Ref Range   Hemoglobin A1C     HbA1c POC (<> result, manual entry) 11.1 4.0 - 5.6 %   HbA1c, POC (prediabetic range)     HbA1c, POC (controlled diabetic range)    POCT Glucose (Device for Home Use)  Result Value Ref Range   Glucose Fasting, POC     POC Glucose 270 (A) 70 - 99 mg/dl    Latest Reference Range & Units 11/21/21 10:27  Total CHOL/HDL Ratio <5.0 (calc) 3.0  Cholesterol <200 mg/dL 222 (H)  HDL Cholesterol > OR = 50 mg/dL 73  LDL Cholesterol (Calc) mg/dL (calc) 131 (H)  MICROALB/CREAT RATIO <30 mcg/mg creat 19  Non-HDL Cholesterol (Calc) <130 mg/dL (calc) 149 (H)  Triglycerides <150 mg/dL 79  TSH mIU/L 5.60 (H)  T4,Free(Direct) 0.8 - 1.8 ng/dL 0.9  Microalb, Ur mg/dL 0.6  Creatinine, Urine 20 - 275 mg/dL 31  (H): Data is abnormally high  Assessment/Plan: Idalys Konecny is a 22 y.o. female with T1DM on a pump (Tslim) and CGM regimen.   A1c is likely above ADA goal of <7.0%.  Dexcom tracing shows she is not meeting goal of TIR >70%. she needs to enter carb amounts into pump.    She continues on synthroid for hypothyroidism. She is clinically euthyroid. She continues to follow a gluten-free diet for celiac disease and has no GI symptoms.  She also continues on rosuvastatin for hyperlipidemia.  She is doing well on low dose OCP.  When a patient is on insulin, intensive monitoring of blood glucose levels and continuous insulin titration is vital to avoid insulin toxicity leading to severe hypoglycemia. Severe hypoglycemia can lead to seizure or death. Hyperglycemia can also result from inadequate insulin dosing and can lead to ketosis requiring ICU admission and intravenous insulin.   1. Type 1 diabetes with hyperglycemia -Encouraged to wear med alert ID every day -Encouraged to rotate injection sites -Provided with my contact information and advised to email/send mychart with questions/need for BG review -CGM download reviewed extensively (see  interpretation above) -Rx sent to pharmacy include: Novolog and synthroid -A1c and CMP with GFR in the next several weeks  2. Insulin Pump in Place -No pump changes today.  Bolus before meals  3. Acquired autoimmune hypothyroidism -Continue current levothyroxine. Check TSH, FT4 in the next several weeks  4. Celiac disease -Continue gluten free diet  5. Hyperlipidemia due to type 1 diabetes mellitus (HCC) -Continue rosuvastatin.  Check lipids in the next several weeks.  6. Menorrhagia with irregular cycle -Continue current OCP  Follow-up:   Has appt with Dr. Kelton Pillar in 07/2022  >  30 minutes spent today reviewing the medical chart, counseling the patient/family, and documenting today's encounter.   Levon Hedger, MD

## 2022-04-19 NOTE — Patient Instructions (Signed)

## 2022-05-08 ENCOUNTER — Telehealth (INDEPENDENT_AMBULATORY_CARE_PROVIDER_SITE_OTHER): Payer: BC Managed Care – PPO | Admitting: Family

## 2022-05-08 ENCOUNTER — Encounter: Payer: Self-pay | Admitting: Family

## 2022-05-08 ENCOUNTER — Other Ambulatory Visit (HOSPITAL_COMMUNITY): Payer: Self-pay

## 2022-05-08 DIAGNOSIS — E109 Type 1 diabetes mellitus without complications: Secondary | ICD-10-CM

## 2022-05-08 DIAGNOSIS — R278 Other lack of coordination: Secondary | ICD-10-CM

## 2022-05-08 DIAGNOSIS — Z719 Counseling, unspecified: Secondary | ICD-10-CM

## 2022-05-08 DIAGNOSIS — K900 Celiac disease: Secondary | ICD-10-CM

## 2022-05-08 DIAGNOSIS — E7801 Familial hypercholesterolemia: Secondary | ICD-10-CM | POA: Diagnosis not present

## 2022-05-08 DIAGNOSIS — F411 Generalized anxiety disorder: Secondary | ICD-10-CM

## 2022-05-08 DIAGNOSIS — E063 Autoimmune thyroiditis: Secondary | ICD-10-CM

## 2022-05-08 DIAGNOSIS — Z7189 Other specified counseling: Secondary | ICD-10-CM

## 2022-05-08 DIAGNOSIS — Z79899 Other long term (current) drug therapy: Secondary | ICD-10-CM

## 2022-05-08 DIAGNOSIS — F900 Attention-deficit hyperactivity disorder, predominantly inattentive type: Secondary | ICD-10-CM

## 2022-05-08 DIAGNOSIS — F819 Developmental disorder of scholastic skills, unspecified: Secondary | ICD-10-CM

## 2022-05-08 MED ORDER — AMPHETAMINE SULFATE 10 MG PO TABS
10.0000 mg | ORAL_TABLET | Freq: Two times a day (BID) | ORAL | 0 refills | Status: AC
  Filled 2022-05-08: qty 120, 30d supply, fill #0

## 2022-05-08 MED ORDER — ESCITALOPRAM OXALATE 10 MG PO TABS
10.0000 mg | ORAL_TABLET | Freq: Every day | ORAL | 1 refills | Status: DC
  Filled 2022-05-08: qty 90, 90d supply, fill #0
  Filled 2023-02-01: qty 90, 90d supply, fill #1

## 2022-05-08 NOTE — Progress Notes (Signed)
[facility] [address]  [place] [address] Dept: [phone] Dept Fax: [phone]  Medication Check visit via Virtual Video   Patient ID:  Kiara Greer  female DOB: Mar 05, 2001   22 y.o.   MRN: [id number]   DATE:05/08/22  PCP: Kandace Blitz, MD  Virtual Visit via Video Note I connected with  Saron on 05/08/22 at  1:00 PM EST by a video enabled telemedicine application and verified that I am speaking with the correct person using two identifiers. Patient/Parent Location: at home  I discussed the limitations, risks, security and privacy concerns of performing an evaluation and management service by telephone and the availability of in person appointments. I also discussed with the parents that there may be a patient responsible charge related to this service. The parents expressed understanding and agreed to proceed.  Provider: Carolann Littler, NP  Location: work location  HPI/CURRENT STATUS: Kiara Greer is here for medication management of the psychoactive medications for ADHD and review of educational and behavioral concerns.   Ayan currently taking Evekeo 10-20 mg and Lexapro 10 mg daily, which is working well. Takes medication at 1030 am. Medication tends to wear off around evening for . Jeslie is able to focus through work.   Brenlee is eating well (eating breakfast, lunch and dinner). Roselyne has does not have appetite suppression and getting enough calories daily.   Sleeping well (goes to bed at 2300-2400 wakes at 0900), sleeping through the night. Eulalia does not have delayed sleep onset and sleep has been consistent.   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 No recent changes  or blood.   Has been healthy with no visits to the PCP. Elliott due yearly.   Family Medical/ Social History: None Kiara Greer Lives with: parents and sibling when she is home from school.   MENTAL HEALTH: Mental Health Issues:   Depression and Anxiety    Allergies: No Known Allergies  Current Medications:  Current Outpatient Medications on File Prior to Visit  Medication Sig Dispense Refill   cetirizine (ZYRTEC) 10 MG tablet Take 10 mg by mouth as needed for allergies.     Continuous Blood Gluc Sensor (DEXCOM G6 SENSOR) MISC CHANGE SENSOR EVERY 10 DAYS 9 each 3   Continuous Blood Gluc Transmit (DEXCOM G6 TRANSMITTER) MISC CHANGE EVERY 3 MONTHS 1 each 3   Glucagon (BAQSIMI TWO PACK) 3 MG/DOSE POWD Place 1 application into the nose as needed. Use as directed if unconscious, unable to take food po, or having a seizure due to hypoglycemia 2 each 1   insulin aspart (NOVOLOG FLEXPEN) 100 UNIT/ML FlexPen Inject as directed by MD, total daily dose up to 50 units. In case of pump failure. 15 mL 6   insulin aspart (NOVOLOG) 100 UNIT/ML injection 300 units in  Insulin Pump every 48 hours 150 mL 3   insulin aspart (NOVOLOG) 100 UNIT/ML injection Inject 300 units in insulin pump every 48-72 hours 120 mL 3   levothyroxine (SYNTHROID) 137 MCG tablet Take 160mcg daily 90 tablet 3   Multiple Vitamin (MULTIVITAMIN) tablet Take 1 tablet by mouth daily.     norethindrone-ethinyl estradiol-FE (BLISOVI FE 1/20) 1-20 MG-MCG tablet Take 1 tablet by mouth daily. 84 tablet 3   rosuvastatin (CRESTOR) 10 MG tablet TAKE 1 TABLET DAILY 90  tablet 3   SYNTHROID 137 MCG tablet TAKE 1 TABLET DAILY 90 tablet 3   acetone, urine, test strip Check ketones per protocol (Patient not taking: Reported on 09/02/2020) 50 each 3   Calcium-Vitamin D-Vitamin K 350-093-81 MG-UNT-MCG CHEW Chew by mouth. (Patient not taking: Reported on 04/19/2022)     Continuous Blood Gluc Sensor (DEXCOM G7 SENSOR) MISC Place a new Dexcom G7 sensor every 10 days.  (Patient not taking: Reported on 04/19/2022) 9 each 3   No current facility-administered medications on file prior to visit.  Medication Side Effects: None  DIAGNOSES:    ICD-10-CM   1. ADHD (attention deficit hyperactivity disorder), inattentive type  F90.0     2. Generalized anxiety disorder  F41.1     3. Hyperlipidemia type II  E78.01     4. Type 1 diabetes mellitus without complication (HCC)  W29.9     5. 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 Celiac disease  K90.0     7. Problems with learning  F81.9     8. Dysgraphia  R27.8     9. Patient counseled  Z71.9     10. Medication management  Z79.899     11. Goals of care, counseling/discussion  Z71.89     ASSESSMENT:      Kiara Greer is a 22 year old female with a history of ADHD, Anxiety, Depression and L/D. She has been maintained on Lexapro and Evekeo with good efficacy reported. Graduated from school and has continued with her job hunt. 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 Has continued searching for full time work. Reading and some socializing outside of applying for jobs. No changes with eating habits or sleeping habits. Recent change of Endocrinology with new doctor in April due to aging out of the pediatric practice. No other reported health changes. Will continue with the same medication regimen with no changes today.   PLAN/RECOMMENDATIONS:  Updates with work, family and health discussed.  Working only part time right now and supported her continued search for full time work.  No concerns with performance of work related duties, but limited with previous experience.   Healthcare updates for routine and specialists reviewed.   Sleep habits discussed with patient and receiving adequate sleep.  Medication management discussed with patient with no changes today.   Counseled medication pharmacokinetics, options, dosage, administration, desired effects, and possible side  effects.   Lexapro 10 mg daily #90 with no RF's Evekeo 10 mg 1-2  BID, #120 with no RF's RX for above e-scribed and sent to pharmacy on record  [address] [place] [address] Phone: [phone] Fax: [phone]  I discussed the assessment and treatment plan with Kiara Greer. Soyla was provided an opportunity to ask questions and all were answered. Sean agreed with the plan and demonstrated an understanding of the instructions.  REVIEW OF CHART, FACE TO FACE CLINIC TIME AND DOCUMENTATION TIME DURING TODAY'S VISIT:  30 mins      NEXT APPOINTMENT:  Return to PCP Telehealth OK  The patient was advised to call back or seek an in-person evaluation if the symptoms worsen or if the condition fails to improve as anticipated.   Carolann Littler, NP

## 2022-05-09 ENCOUNTER — Other Ambulatory Visit (HOSPITAL_COMMUNITY): Payer: Self-pay

## 2022-05-24 DIAGNOSIS — Z794 Long term (current) use of insulin: Secondary | ICD-10-CM | POA: Diagnosis not present

## 2022-05-24 DIAGNOSIS — E1065 Type 1 diabetes mellitus with hyperglycemia: Secondary | ICD-10-CM | POA: Diagnosis not present

## 2022-05-24 DIAGNOSIS — E109 Type 1 diabetes mellitus without complications: Secondary | ICD-10-CM | POA: Diagnosis not present

## 2022-05-29 DIAGNOSIS — Z794 Long term (current) use of insulin: Secondary | ICD-10-CM | POA: Diagnosis not present

## 2022-05-29 DIAGNOSIS — E109 Type 1 diabetes mellitus without complications: Secondary | ICD-10-CM | POA: Diagnosis not present

## 2022-05-29 DIAGNOSIS — E1065 Type 1 diabetes mellitus with hyperglycemia: Secondary | ICD-10-CM | POA: Diagnosis not present

## 2022-06-20 ENCOUNTER — Telehealth (INDEPENDENT_AMBULATORY_CARE_PROVIDER_SITE_OTHER): Payer: Self-pay | Admitting: Pediatrics

## 2022-06-20 NOTE — Telephone Encounter (Signed)
  Name of who is calling:[facility]   Caller's Relationship to Patient:pharmacy   Best contact number:[phone] opt 2  Provider they see:Dr. Charna Archer   Reason for call:pharmacy called and LVM req a call back needing approval for a manufacture change. They asked for a call back at the number listed below.  [phone] opt 2  Ref # [id number]     PRESCRIPTION REFILL ONLY  Name of prescription:  Pharmacy:

## 2022-06-21 ENCOUNTER — Encounter (INDEPENDENT_AMBULATORY_CARE_PROVIDER_SITE_OTHER): Payer: Self-pay | Admitting: Pediatrics

## 2022-06-21 NOTE — Telephone Encounter (Signed)
Signed form stating that brand name medically necessary.  Also sent mychart message to the patient asking if she still wants Brand name synthroid, and if so, if her insurance is charging more than $75 for a 3 month supply.  We can change to Synthroid Delivers program if family prefers.  Levon Hedger, MD

## 2022-06-26 ENCOUNTER — Other Ambulatory Visit (INDEPENDENT_AMBULATORY_CARE_PROVIDER_SITE_OTHER): Payer: Self-pay | Admitting: Family

## 2022-06-26 ENCOUNTER — Telehealth (INDEPENDENT_AMBULATORY_CARE_PROVIDER_SITE_OTHER): Payer: Self-pay

## 2022-06-26 DIAGNOSIS — E063 Autoimmune thyroiditis: Secondary | ICD-10-CM

## 2022-06-26 MED ORDER — SYNTHROID 137 MCG PO TABS: 137.0000 ug | ORAL_TABLET | Freq: Every day | ORAL | 3 refills | Status: DC

## 2022-06-26 NOTE — Telephone Encounter (Signed)
Per Frederico Hamman, intiate PA for Synthroid for pt. Initiated on covermymeds.  Covermymeds stated PA not required.

## 2022-07-10 DIAGNOSIS — H5000 Unspecified esotropia: Secondary | ICD-10-CM | POA: Diagnosis not present

## 2022-07-10 DIAGNOSIS — E109 Type 1 diabetes mellitus without complications: Secondary | ICD-10-CM | POA: Diagnosis not present

## 2022-07-11 ENCOUNTER — Telehealth (INDEPENDENT_AMBULATORY_CARE_PROVIDER_SITE_OTHER): Payer: Self-pay | Admitting: Pediatrics

## 2022-07-11 NOTE — Telephone Encounter (Signed)
Received report form [facility]- Kiara Greer had an eye exam 07/10/22 that showed no retinopathy.  Annual exam recommended.  Casimiro Needle, MD

## 2022-07-25 ENCOUNTER — Other Ambulatory Visit (HOSPITAL_COMMUNITY): Payer: Self-pay

## 2022-07-25 ENCOUNTER — Encounter: Payer: Self-pay | Admitting: Internal Medicine

## 2022-07-25 ENCOUNTER — Ambulatory Visit: Payer: BC Managed Care – PPO | Admitting: Internal Medicine

## 2022-07-25 VITALS — BP 120/74 | HR 104 | Ht 65.0 in | Wt 151.0 lb

## 2022-07-25 DIAGNOSIS — Z9641 Presence of insulin pump (external) (internal): Secondary | ICD-10-CM

## 2022-07-25 DIAGNOSIS — E1065 Type 1 diabetes mellitus with hyperglycemia: Secondary | ICD-10-CM

## 2022-07-25 DIAGNOSIS — E1069 Type 1 diabetes mellitus with other specified complication: Secondary | ICD-10-CM

## 2022-07-25 DIAGNOSIS — E063 Autoimmune thyroiditis: Secondary | ICD-10-CM | POA: Diagnosis not present

## 2022-07-25 DIAGNOSIS — E109 Type 1 diabetes mellitus without complications: Secondary | ICD-10-CM

## 2022-07-25 LAB — BASIC METABOLIC PANEL
BUN: 8 mg/dL (ref 6–23)
CO2: 27 mEq/L (ref 19–32)
Calcium: 9.9 mg/dL (ref 8.4–10.5)
Chloride: 102 mEq/L (ref 96–112)
Creatinine, Ser: 0.69 mg/dL (ref 0.40–1.20)
GFR: 122.54 mL/min (ref >60.00)
Glucose, Bld: 229 mg/dL — ABNORMAL HIGH (ref 70–99)
Potassium: 4.5 mEq/L (ref 3.5–5.1)
Sodium: 136 mEq/L (ref 135–145)

## 2022-07-25 LAB — LIPID PANEL
Cholesterol: 172 mg/dL (ref 0–200)
HDL: 62.1 mg/dL (ref >39.00)
LDL Cholesterol: 90 mg/dL (ref 0–99)
NonHDL: 109.58
Total CHOL/HDL Ratio: 3
Triglycerides: 100 mg/dL (ref 0.0–149.0)
VLDL: 20 mg/dL (ref 0.0–40.0)

## 2022-07-25 LAB — TSH: TSH: 2.32 u[IU]/mL (ref 0.35–5.50)

## 2022-07-25 MED ORDER — BAQSIMI TWO PACK 3 MG/DOSE NA POWD
1.0000 "application " | NASAL | 1 refills | Status: DC | PRN
  Filled 2022-07-25: qty 2, 2d supply, fill #0
  Filled 2022-08-11: qty 2, 30d supply, fill #0

## 2022-07-25 MED ORDER — NOVOLOG FLEXPEN 100 UNIT/ML ~~LOC~~ SOPN
PEN_INJECTOR | SUBCUTANEOUS | 3 refills | Status: DC
  Filled 2022-07-25 (×2): qty 45, 75d supply, fill #0
  Filled 2022-08-11: qty 54, 90d supply, fill #0

## 2022-07-25 NOTE — Progress Notes (Unsigned)
Name: Kiara Greer  MRN/ DOB: [id number], November 14, 2000   Age/ Sex: 22 y.o., female    PCP: Santa Genera, MD   Reason for Endocrinology Evaluation: Type 1 Diabetes Mellitus     Date of Initial Endocrinology Visit: 07/25/2022     PATIENT IDENTIFIER: [name] is a 22 y.o. female with a past medical history of T1DM, hypothyroid, ADHD, celiac disease. The patient presented for initial endocrinology clinic visit on 07/25/2022 for consultative assistance with her diabetes management.    HPI: [name] was    Diagnosed with DM  at age 22  Prior Medications tried/Intolerance: she was started on Medtronic 2016 , switched to Tandem 11/2018 Currently checking blood sugars multiple  x / day, through CGM.  Hypoglycemia episodes : yes               Symptoms: yes                 Frequency: 3/week  Hemoglobin A1c has ranged from 6.8% in 2012, peaking at 11.4% in 2020.   In terms of diet, the patient eats 2 meals a day   Denies nausea, vomiting or diarrhea     THYROID HISTORY:  She was diagnosed with hashimoto's thyroiditis at 18 months.   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 During the visit the pump basal and bolus doses were reviewed including carb/insulin rations and supplemental doses. The clinical list was updated. The glucose meter download was reviewed in detail to determine if the current pump settings are providing the best glycemic control without excessive hypoglycemia.  Pump and meter download:    Pump   tandem Settings   Insulin type   novolog   Basal rate       0000  1.0 u/h    0600 1.250   0800 1.300   1200 1.400   2200 1.350          I:C ratio       0000 1:9                  Sensitivity        0000  30      Goal       0000  110-120            Type & Model of Pump: tandem Insulin Type: Currently using novolog .  Body mass index is 25.13 kg/m.  PUMP STATISTICS: Average BG: 167  Average Daily Carbs (g): 0 Average Total Daily Insulin: 45.62 Average Daily Basal: 27 (57 %) Average Daily Bolus: 20 (43 %)     HOME DIABETES REGIMEN: NovoLog Synthroid 137     Statin: No ACE-I/ARB: No Prior Diabetic Education: Yes   CONTINUOUS GLUCOSE MONITORING RECORD INTERPRETATION    Dates of Recording: 4/9-4/22/2024  Sensor description:dexcom  Results statistics:   CGM use % of time 32  Average and SD 167/69  Time in range   88    %  % Time Above 180 8  % Time Below target 3   Glycemic patterns summary: Bg's optimal at night but trend up during the day   Hyperglycemic episodes  postprandial   Hypoglycemic episodes occurred after a correction  Overnight periods: optimal     DIABETIC COMPLICATIONS: Microvascular complications:   Denies: CKD, retinopathy Last eye exam: Completed 07/10/2022  Macrovascular complications:   Denies: CAD, PVD, CVA   PAST HISTORY: Past Medical History:  Past Medical History:  Diagnosis Date   ADHD (attention deficit hyperactivity disorder)    Celiac disease    Diabetes mellitus type I    Duanes syndrome    Goiter    Hashimoto's thyroiditis    Hyperlipidemia type II    Hypoglycemia associated with diabetes    Hypoglycemia unawareness in type 1 diabetes mellitus    Hypothyroidism, acquired, autoimmune    Past Surgical History:  Past Surgical History:  Procedure Laterality Date   NO PAST SURGERIES      Social History:  reports that she has never smoked. She has never used smokeless tobacco. She reports current alcohol use. She reports that she does not use drugs. Family History:  Family History  Problem Relation Age of Onset   Diabetes Mother    Thyroid disease Mother    Celiac disease Mother    Diabetes  Sister    Thyroid disease Sister    Celiac disease Sister    Diabetes Maternal Uncle    Thyroid disease Maternal Grandmother    Celiac disease Maternal Grandmother    Cancer Neg Hx      HOME MEDICATIONS: Allergies as of 07/25/2022   No Known Allergies      Medication List        Accurate as of July 25, 2022 11:40 AM. If you have any questions, ask your nurse or doctor.          STOP taking these medications    acetone (urine) test strip Stopped by: Scarlette Shorts, MD       TAKE these medications    Amphetamine Sulfate 10 MG Tabs Commonly known as: Evekeo Take 1-2 tablets (10-20 mg total) by mouth 2 (two) times daily.   Baqsimi Two Pack 3 MG/DOSE Powd Generic drug: Glucagon Place 1 application into the nose as needed. Use as directed if unconscious, unable to take food po, or having a seizure due to hypoglycemia   Calcium-Vitamin D-Vitamin K 500-100-40 MG-UNT-MCG Chew Chew by mouth.   cetirizine 10 MG tablet Commonly known as: ZYRTEC Take 10 mg by mouth as needed for allergies.   Dexcom G6 Transmitter Misc CHANGE EVERY 3 MONTHS   Dexcom G7 Sensor Misc Place a new Dexcom G7 sensor every 10 days. What changed: Another medication with the same name was removed. Continue taking this medication, and follow the directions you see here. Changed by: Scarlette Shorts, MD   escitalopram 10 MG tablet Commonly known as: Lexapro Take 1 tablet (10 mg total) by mouth daily.   insulin aspart 100 UNIT/ML injection Commonly known as: novoLOG 300 units in  Insulin Pump every 48 hours   NovoLOG FlexPen 100 UNIT/ML FlexPen Generic drug: insulin aspart Inject as directed by MD, total daily dose up to 50 units. In case of pump failure.   insulin aspart 100 UNIT/ML injection Commonly known as: novoLOG Inject 300 units in insulin pump every 48-72 hours   multivitamin tablet Take 1 tablet by mouth daily.   norethindrone-ethinyl estradiol-FE 1-20 MG-MCG  tablet Commonly known as: Blisovi FE 1/20 Take 1 tablet by mouth daily.   rosuvastatin 10 MG tablet Commonly known as: CRESTOR TAKE 1 TABLET DAILY   Synthroid 137 MCG tablet Generic drug: levothyroxine Take 1 tablet (137 mcg total)  by mouth daily.         ALLERGIES: No Known Allergies   REVIEW OF SYSTEMS: A comprehensive ROS was conducted with the patient and is negative except as per HPI  OBJECTIVE:   VITAL SIGNS: BP 120/74 (BP Location: Left Arm, Patient Position: Sitting, Cuff Size: Small)   Pulse (!) 104   Ht  (1.651 m)   Wt 151 lb (68.5 kg)   SpO2 99%   BMI 25.13 kg/m    PHYSICAL EXAM:  General: Pt appears well and is in NAD  Neck: General: Supple without adenopathy or carotid bruits. Thyroid: Thyroid size normal.  No goiter or nodules appreciated.   Lungs: Clear with good BS bilat with no rales, rhonchi, or wheezes  Heart: RRR   Abdomen:  soft, nontender  Extremities:  Lower extremities - No pretibial edema. No lesions.  Skin: Normal texture and temperature to palpation. No rash noted.  Neuro: MS is good with appropriate affect, pt is alert and Ox3    DM foot exam: 07/25/2022  The skin of the feet is intact without sores or ulcerations. The pedal pulses are 2+ on right and 2+ on left. The sensation is intact to a screening 5.07, 10 gram monofilament bilaterally   DATA REVIEWED:  Lab Results  Component Value Date   HGBA1C 11.1 11/23/2021   HGBA1C 7.9 (A) 07/21/2021   HGBA1C 7.7 (A) 12/28/2020   Lab Results  Component Value Date   MICROALBUR 0.6 11/21/2021   LDLCALC 131 (H) 11/21/2021   CREATININE 0.58 03/23/2020   Lab Results  Component Value Date   MICRALBCREAT 19 11/21/2021    Lab Results  Component Value Date   CHOL 222 (H) 11/21/2021   HDL 73 11/21/2021   LDLCALC 131 (H) 11/21/2021   TRIG 79 11/21/2021   CHOLHDL 3.0 11/21/2021        ASSESSMENT / PLAN / RECOMMENDATIONS:   1) Type 1 Diabetes Mellitus, Poorly controlled,  Without complications - Most recent A1c of 9.3 %. Goal A1c < 7.0 %.    Plan: GENERAL: I have discussed with the patient the pathophysiology of diabetes. We went over the natural progression of the disease. I explained the complications associated with diabetes including retinopathy, nephropathy, neuropathy as well as increased risk of cardiovascular disease. We went over the benefit seen with glycemic control.  I explained to the patient that diabetic patients are at higher than normal risk for amputations.  I have reviewed her pump/CGM download, the patient has been noted with postprandial hyperglycemia due to lack of carbohydrate bolus.  Patient has been riding on her basal rate as well as correction We discussed the pathophysiology and the importance of bolus with each meal to prevent postprandial hyperglycemia I have asked the patient to enter # 3 g of carbohydrates with each meal (she is not going to count carbohydrates), so I have changed her insulin to carb ratio from 1:9 to 1:1 so that where she will get 3 units with each meal She has also been noted with post bolus hypoglycemia, I will adjust her correction factor  MEDICATIONS: NovoLog   Pump   tandem Settings   Insulin type   novolog   Basal rate       0000  1.0 u/h    0600 1.250   0800 1.300   1200 1.400   2200 1.350          I:C ratio       0000 1:1 Enter #3g  with each meal                  Sensitivity       0000  35      Goal       0000  110-120        EDUCATION / INSTRUCTIONS: BG monitoring instructions: Patient is instructed to check her blood sugars 3 times a day, before each meal. Call [place] [facility] if: BG persistently < 70  I reviewed the Rule of 15 for the treatment of hypoglycemia in detail with the patient. Literature supplied.   2) Diabetic complications:  Eye: Does not have known diabetic retinopathy.  Neuro/ Feet: Does not have known diabetic peripheral neuropathy. Renal: Patient  does not have known baseline CKD. She is not on an ACEI/ARB at present.  3) Hashimoto's thyroiditis:  -Patient is clinically euthyroid - Pt educated extensively on the correct way to take levothyroxine (first thing in the morning with water, 30 minutes before eating or taking other medications). - Pt encouraged to double dose the following day if she were to miss a dose given long half-life of levothyroxine.   Follow-up in 3 months  Signed electronically by: Lyndle Herrlich, MD  [facility]  [facility] [address] [place] [place], [place] [address] Phone: [phone] FAX: [phone]   CC: Santa Genera, MD [address] [place] [address] Phone: [phone]  Fax: [phone]    Return to Endocrinology clinic as below: No future appointments.

## 2022-07-25 NOTE — Patient Instructions (Addendum)
Enter #3 grams of carbohydrates before each meal   - HOW TO TREAT LOW BLOOD SUGARS (Blood sugar LESS THAN 70 MG/DL) Please follow the RULE OF 15 for the treatment of hypoglycemia treatment (when your (blood sugars are less than 70 mg/dL)   STEP 1: Take 15 grams of carbohydrates when your blood sugar is low, which includes:  3-4 GLUCOSE TABS  OR 3-4 OZ OF JUICE OR REGULAR SODA OR ONE TUBE OF GLUCOSE GEL    STEP 2: RECHECK blood sugar in 15 MINUTES STEP 3: If your blood sugar is still low at the 15 minute recheck --> then, go back to STEP 1 and treat AGAIN with another 15 grams of carbohydrates.

## 2022-07-26 ENCOUNTER — Other Ambulatory Visit (HOSPITAL_COMMUNITY): Payer: Self-pay

## 2022-07-26 LAB — POCT GLYCOSYLATED HEMOGLOBIN (HGB A1C): Hemoglobin A1C: 9.3 % — AB (ref 4.0–5.6)

## 2022-07-26 MED ORDER — SYNTHROID 137 MCG PO TABS
137.0000 ug | ORAL_TABLET | Freq: Every day | ORAL | 3 refills | Status: DC
Start: 2022-07-26 — End: 2022-09-05
  Filled 2022-07-26: qty 90, 90d supply, fill #0

## 2022-07-27 ENCOUNTER — Other Ambulatory Visit (HOSPITAL_COMMUNITY): Payer: Self-pay

## 2022-07-31 ENCOUNTER — Other Ambulatory Visit (HOSPITAL_COMMUNITY): Payer: Self-pay

## 2022-08-02 ENCOUNTER — Other Ambulatory Visit (HOSPITAL_COMMUNITY): Payer: Self-pay

## 2022-08-03 ENCOUNTER — Other Ambulatory Visit (HOSPITAL_COMMUNITY): Payer: Self-pay

## 2022-08-04 ENCOUNTER — Telehealth: Payer: BC Managed Care – PPO | Admitting: Family

## 2022-08-11 ENCOUNTER — Other Ambulatory Visit (HOSPITAL_COMMUNITY): Payer: Self-pay

## 2022-08-19 ENCOUNTER — Other Ambulatory Visit (HOSPITAL_COMMUNITY): Payer: Self-pay

## 2022-08-24 DIAGNOSIS — E1065 Type 1 diabetes mellitus with hyperglycemia: Secondary | ICD-10-CM | POA: Diagnosis not present

## 2022-08-24 DIAGNOSIS — Z794 Long term (current) use of insulin: Secondary | ICD-10-CM | POA: Diagnosis not present

## 2022-08-24 DIAGNOSIS — E109 Type 1 diabetes mellitus without complications: Secondary | ICD-10-CM | POA: Diagnosis not present

## 2022-08-28 ENCOUNTER — Other Ambulatory Visit (INDEPENDENT_AMBULATORY_CARE_PROVIDER_SITE_OTHER): Payer: Self-pay | Admitting: Pediatrics

## 2022-08-28 DIAGNOSIS — E1069 Type 1 diabetes mellitus with other specified complication: Secondary | ICD-10-CM

## 2022-09-05 ENCOUNTER — Encounter: Payer: Self-pay | Admitting: Internal Medicine

## 2022-09-05 ENCOUNTER — Other Ambulatory Visit: Payer: Self-pay

## 2022-09-05 DIAGNOSIS — E063 Autoimmune thyroiditis: Secondary | ICD-10-CM

## 2022-09-05 DIAGNOSIS — E1069 Type 1 diabetes mellitus with other specified complication: Secondary | ICD-10-CM

## 2022-09-05 DIAGNOSIS — Z9641 Presence of insulin pump (external) (internal): Secondary | ICD-10-CM

## 2022-09-05 MED ORDER — NOVOLOG FLEXPEN 100 UNIT/ML ~~LOC~~ SOPN
PEN_INJECTOR | SUBCUTANEOUS | 3 refills | Status: DC
Start: 2022-09-05 — End: 2022-10-26

## 2022-09-05 MED ORDER — ROSUVASTATIN CALCIUM 10 MG PO TABS
ORAL_TABLET | ORAL | 3 refills | Status: DC
Start: 2022-09-05 — End: 2023-04-27

## 2022-09-05 MED ORDER — SYNTHROID 137 MCG PO TABS
137.0000 ug | ORAL_TABLET | Freq: Every day | ORAL | 3 refills | Status: DC
Start: 2022-09-05 — End: 2022-10-26

## 2022-09-13 ENCOUNTER — Other Ambulatory Visit (INDEPENDENT_AMBULATORY_CARE_PROVIDER_SITE_OTHER): Payer: Self-pay | Admitting: Pediatrics

## 2022-09-13 DIAGNOSIS — N921 Excessive and frequent menstruation with irregular cycle: Secondary | ICD-10-CM

## 2022-10-19 ENCOUNTER — Encounter (INDEPENDENT_AMBULATORY_CARE_PROVIDER_SITE_OTHER): Payer: Self-pay

## 2022-10-26 ENCOUNTER — Encounter: Payer: Self-pay | Admitting: Internal Medicine

## 2022-10-26 ENCOUNTER — Ambulatory Visit: Payer: BC Managed Care – PPO | Admitting: Internal Medicine

## 2022-10-26 VITALS — BP 122/80 | HR 88 | Ht 65.0 in | Wt 149.0 lb

## 2022-10-26 DIAGNOSIS — Z9641 Presence of insulin pump (external) (internal): Secondary | ICD-10-CM | POA: Diagnosis not present

## 2022-10-26 DIAGNOSIS — E785 Hyperlipidemia, unspecified: Secondary | ICD-10-CM

## 2022-10-26 DIAGNOSIS — E1065 Type 1 diabetes mellitus with hyperglycemia: Secondary | ICD-10-CM

## 2022-10-26 DIAGNOSIS — E063 Autoimmune thyroiditis: Secondary | ICD-10-CM | POA: Diagnosis not present

## 2022-10-26 LAB — POCT GLYCOSYLATED HEMOGLOBIN (HGB A1C): Hemoglobin A1C: 10 % — AB (ref 4.0–5.6)

## 2022-10-26 MED ORDER — SYNTHROID 137 MCG PO TABS: 137.0000 ug | ORAL_TABLET | Freq: Every day | ORAL | 3 refills | Status: DC

## 2022-10-26 MED ORDER — INSULIN ASPART 100 UNIT/ML IJ SOLN: 60.0000 [IU] | INTRAMUSCULAR | 3 refills | Status: DC

## 2022-10-26 MED ORDER — BAQSIMI TWO PACK 3 MG/DOSE NA POWD
1.0000 | NASAL | 1 refills | Status: AC | PRN
Start: 2022-10-26 — End: ?

## 2022-10-26 NOTE — Patient Instructions (Addendum)
Enter #4 grams of carbohydrates before each meal  Enter # 2 grams with a snack    Check out [email or web address], [email or web address]  - HOW TO TREAT LOW BLOOD SUGARS (Blood sugar LESS THAN 70 MG/DL) Please follow the RULE OF 15 for the treatment of hypoglycemia treatment (when your (blood sugars are less than 70 mg/dL)   STEP 1: Take 15 grams of carbohydrates when your blood sugar is low, which includes:  3-4 GLUCOSE TABS  OR 3-4 OZ OF JUICE OR REGULAR SODA OR ONE TUBE OF GLUCOSE GEL    STEP 2: RECHECK blood sugar in 15 MINUTES STEP 3: If your blood sugar is still low at the 15 minute recheck --> then, go back to STEP 1 and treat AGAIN with another 15 grams of carbohydrates.

## 2022-10-26 NOTE — Progress Notes (Signed)
Name: Kiara Greer  MRN/ DOB: [id number], [birth year]   Age/ Sex: 22 y.o., female    PCP: Patient, No Pcp Per   Reason for Endocrinology Evaluation: Type 1 Diabetes Mellitus     Date of Initial Endocrinology Visit: 07/25/2022    PATIENT IDENTIFIER: Kiara Greer is a 22 y.o. female with a past medical history of T1DM, hypothyroid, ADHD, celiac disease. The patient presented for initial endocrinology clinic visit on 07/25/2022 for consultative assistance with her diabetes management.    HPI: [name] was    Diagnosed with DM  at age 20  Prior Medications tried/Intolerance: she was started on Medtronic 2016 , switched to Tandem 11/2018 Hemoglobin A1c has ranged from 6.8% in 2012, peaking at 11.4% in 2020.    THYROID HISTORY:  She was diagnosed with hashimoto's thyroiditis at 30 months of age.   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  She is accompanied by her mother today she checks her blood sugars multiple times daily. The patient has not had hypoglycemic episodes since the last clinic visit, but has been noted with tight BG's overnight.    She eats 2 meals a day ( breakfast and supper) snacks for lunch   Denies constipation or diarrhea  Denies local neck swelling  Denies nausea    This patient with type 1 diabetes is treated with Tandem  (insulin pump). During the visit the pump basal and bolus doses were reviewed including carb/insulin rations and supplemental doses. The clinical list was updated. The glucose meter download was reviewed in detail to determine if the current pump settings are providing the best glycemic control without excessive hypoglycemia.  Pump and meter download:     Pump   tandem Settings   Insulin type   novolog   Basal rate       0000  1.0 u/h     0600 1.250   0800 1.300   1200 1.400   2200 1.350          I:C ratio       0000 1:1 Enter #3g with each meal                  Sensitivity       0000  35      Goal       0000  110-120            Type & Model of Pump: tandem Insulin Type: Currently using novolog .  Body mass index is 24.79 kg/m.  PUMP STATISTICS: Average BG: 202 Average Daily Carbs (g): 2 Average Total Daily Insulin: 46.53 Average Daily Basal:34 (64 %) Average Daily Bolus: 37 (19 %)     HOME DIABETES REGIMEN: NovoLog Synthroid 137     Statin: No ACE-I/ARB: No Prior Diabetic Education: Yes   CONTINUOUS GLUCOSE MONITORING RECORD INTERPRETATION    Dates of Recording:7/12-7/25/2024  Sensor description:dexcom  Results statistics:   CGM use % of time 27  Average and SD 202  Time in range 81    %  % Time Above 180 18  % Time Below target 1   Glycemic patterns summary: Bg's optimal at night but trend up during the day   Hyperglycemic episodes  postprandial   Hypoglycemic episodes occurred at night  Overnight periods: trends down  DIABETIC COMPLICATIONS: Microvascular complications:   Denies: CKD, retinopathy Last eye exam: Completed 07/10/2022  Macrovascular complications:   Denies: CAD, PVD, CVA   PAST HISTORY: Past Medical History:  Past Medical History:  Diagnosis Date   ADHD (attention deficit hyperactivity disorder)    Celiac disease    Diabetes mellitus type I (HCC)    Duanes syndrome    Goiter    Hashimoto's thyroiditis    Hyperlipidemia type II    Hypoglycemia associated with diabetes (HCC)    Hypoglycemia unawareness in type 1 diabetes mellitus (HCC)    Hypothyroidism, acquired, autoimmune    Past Surgical History:  Past Surgical History:  Procedure Laterality Date   NO PAST SURGERIES      Social History:  reports that she has never smoked. She has never used smokeless tobacco. She reports current alcohol use. She reports that she does not  use drugs. Family History:  Family History  Problem Relation Age of Onset   Diabetes Mother    Thyroid disease Mother    Celiac disease Mother    Diabetes Sister    Thyroid disease Sister    Celiac disease Sister    Diabetes Maternal Uncle    Thyroid disease Maternal Grandmother    Celiac disease Maternal Grandmother    Cancer Neg Hx      HOME MEDICATIONS: Allergies as of 10/26/2022   No Known Allergies      Medication List        Accurate as of October 26, 2022  3:15 PM. If you have any questions, ask your nurse or doctor.          Amphetamine Sulfate 10 MG Tabs Commonly known as: Evekeo Take 1-2 tablets (10-20 mg total) by mouth 2 (two) times daily.   Baqsimi Two Pack 3 MG/DOSE Powd Generic drug: Glucagon Place 1 application  into the nose as needed. Use as directed if unconscious, unable to take food by mouth, or having a seizure due to hypoglycemia   Calcium-Vitamin D-Vitamin K 500-100-40 MG-UNT-MCG Chew Chew by mouth.   cetirizine 10 MG tablet Commonly known as: ZYRTEC Take 10 mg by mouth as needed for allergies.   Dexcom G7 Sensor Misc Place a new Dexcom G7 sensor every 10 days.   escitalopram 10 MG tablet Commonly known as: Lexapro Take 1 tablet (10 mg total) by mouth daily.   Hailey FE 1/20 1-20 MG-MCG tablet Generic drug: norethindrone-ethinyl estradiol-FE TAKE 1 TABLET DAILY   insulin aspart 100 UNIT/ML injection Commonly known as: NovoLOG Inject 60 Units into the skin continuous. Max daily 60 units per pump What changed:  additional instructions Another medication with the same name was removed. Continue taking this medication, and follow the directions you see here. Changed by: Johnney Ou Brandalyn Harting   multivitamin tablet Take 1 tablet by mouth daily.   rosuvastatin 10 MG tablet Commonly known as: CRESTOR TAKE 1 TABLET DAILY   Synthroid 137 MCG tablet Generic drug: levothyroxine Take 1 tablet (137 mcg total) by mouth daily.          ALLERGIES: No Known Allergies   REVIEW OF SYSTEMS: A comprehensive ROS was conducted with the patient and is negative except as per HPI  OBJECTIVE:   VITAL SIGNS: BP 122/80 (BP Location: Right Arm, Patient Position: Sitting, Cuff Size: Small)   Pulse 88   Ht 5\' 5"  (1.651 m)   Wt 149 lb (67.6 kg)   SpO2 96%   BMI 24.79 kg/m    PHYSICAL EXAM:  General: Pt appears well and is in NAD  Lungs: Clear with good BS bilat with no rales, rhonchi, or wheezes  Heart: RRR   Abdomen:  soft, nontender  Extremities:  Lower extremities - No pretibial edema  Neuro: MS is good with appropriate affect, pt is alert and Ox3    DM foot exam: 07/25/2022  The skin of the feet is intact without sores or ulcerations. The pedal pulses are 2+ on right and 2+ on left. The sensation is intact to a screening 5.07, 10 gram monofilament bilaterally   DATA REVIEWED:  Lab Results  Component Value Date   HGBA1C 10.0 (A) 10/26/2022   HGBA1C 9.3 (A) 07/26/2022   HGBA1C 11.1 11/23/2021    Latest Reference Range & Units 07/25/22 12:03  Sodium 135 - 145 mEq/L 136  Potassium 3.5 - 5.1 mEq/L 4.5  Chloride 96 - 112 mEq/L 102  CO2 19 - 32 mEq/L 27  Glucose 70 - 99 mg/dL 875 (H)  BUN 6 - 23 mg/dL 8  Creatinine 6.43 - 3.29 mg/dL 5.18  Calcium 8.4 - 84.1 mg/dL 9.9  GFR >66.06 mL/min 122.54    Latest Reference Range & Units 07/25/22 12:03  Total CHOL/HDL Ratio  3  Cholesterol 0 - 200 mg/dL 301  HDL Cholesterol >60.10 mg/dL 93.23  LDL (calc) 0 - 99 mg/dL 90  NonHDL  557.32  Triglycerides 0.0 - 149.0 mg/dL 202.5  VLDL 0.0 - 42.7 mg/dL 06.2    Latest Reference Range & Units 07/25/22 12:03  TSH 0.35 - 5.50 uIU/mL 2.32     ASSESSMENT / PLAN / RECOMMENDATIONS:   1) Type 1 Diabetes Mellitus, Poorly controlled, Without complications - Most recent A1c of 10.0 %. Goal A1c < 7.0 %.     -Patient continues with worsening glycemic control -In reviewing her pump/CGM data, the patient does have tight  BG's overnight but continues with hypoglycemia during the day, she has not been bolusing for meals as previously advised, patient was again encouraged to enter #4 grams of carbohydrates with each meal, as she does not count carbohydrates, and 2 g of carbohydrates with snacks -I have also decreased her basal rate at midnight to prevent hypoglycemia -Her pump is out of warranty, she will contact tandem to get a replacement  -Preconception counseling was done on her initial visit 07/2022  MEDICATIONS: NovoLog   Pump   tandem Settings   Insulin type   novolog   Basal rate       0000  0.9 u/h    0600 1.250   0800 1.300   1200 1.400   2200 1.350          I:C ratio       0000 1:1 Enter #4g with each meal/#2 with snacks                  Sensitivity       0000  35      Goal       0000  110-120        EDUCATION / INSTRUCTIONS: BG monitoring instructions: Patient is instructed to check her blood sugars 3 times a day, before each meal. Call [facility] if: BG persistently < 70  I reviewed the Rule of 15 for the treatment of hypoglycemia in detail with the patient. Literature supplied.   2) Diabetic complications:  Eye: Does not have known diabetic retinopathy.  Neuro/ Feet: Does not have known diabetic peripheral neuropathy. Renal: Patient does not have  known baseline CKD. She is not on an ACEI/ARB at present.  3) Hashimoto's thyroiditis:  -Patient is clinically euthyroid -TSH has been normal -No change  Medication  Continue Synthroid 137 mcg daily   4)Dyslipidemia:  -Patient is on statins through her previous provider, which we continued -Lipid panel acceptable  Medication Continue rosuvastatin 10 mg daily    Follow-up in 4 months  Signed electronically by: Lyndle Herrlich, MD  [facility]  [facility] [address] [place] [place], [place] [address] Phone: [phone] FAX: [phone]    CC: Patient, No Pcp Per No address on file Phone: None  Fax: None    Return to Endocrinology clinic as below: Future Appointments  Date Time Provider Department Center  03/23/2023 12:10 PM Janit Cutter, Konrad Dolores, MD [id number] None

## 2022-10-30 ENCOUNTER — Other Ambulatory Visit: Payer: Self-pay

## 2022-10-30 DIAGNOSIS — E063 Autoimmune thyroiditis: Secondary | ICD-10-CM

## 2022-10-30 MED ORDER — SYNTHROID 137 MCG PO TABS
137.0000 ug | ORAL_TABLET | Freq: Every day | ORAL | 3 refills | Status: DC
Start: 2022-10-30 — End: 2023-04-27

## 2022-11-02 ENCOUNTER — Other Ambulatory Visit (HOSPITAL_COMMUNITY): Payer: Self-pay

## 2022-11-02 DIAGNOSIS — K900 Celiac disease: Secondary | ICD-10-CM | POA: Diagnosis not present

## 2022-11-02 DIAGNOSIS — F411 Generalized anxiety disorder: Secondary | ICD-10-CM | POA: Diagnosis not present

## 2022-11-02 DIAGNOSIS — E785 Hyperlipidemia, unspecified: Secondary | ICD-10-CM | POA: Diagnosis not present

## 2022-11-02 DIAGNOSIS — F900 Attention-deficit hyperactivity disorder, predominantly inattentive type: Secondary | ICD-10-CM | POA: Diagnosis not present

## 2022-11-02 MED ORDER — ESCITALOPRAM OXALATE 20 MG PO TABS
20.0000 mg | ORAL_TABLET | Freq: Every day | ORAL | 3 refills | Status: AC
  Filled 2022-11-02: qty 90, 90d supply, fill #0
  Filled 2023-01-29: qty 90, 90d supply, fill #1
  Filled 2023-03-22 – 2023-04-24 (×3): qty 90, 90d supply, fill #2
  Filled 2023-07-23: qty 90, 90d supply, fill #3

## 2022-11-02 MED ORDER — AMPHETAMINE SULFATE 10 MG PO TABS
20.0000 mg | ORAL_TABLET | Freq: Every day | ORAL | 0 refills | Status: DC
Start: 2022-11-02 — End: 2023-01-30
  Filled 2022-11-02 – 2022-11-14 (×3): qty 60, 30d supply, fill #0

## 2022-11-07 ENCOUNTER — Institutional Professional Consult (permissible substitution): Payer: BC Managed Care – PPO | Admitting: Family

## 2022-11-08 DIAGNOSIS — E1065 Type 1 diabetes mellitus with hyperglycemia: Secondary | ICD-10-CM | POA: Diagnosis not present

## 2022-11-08 DIAGNOSIS — E109 Type 1 diabetes mellitus without complications: Secondary | ICD-10-CM | POA: Diagnosis not present

## 2022-11-08 DIAGNOSIS — Z794 Long term (current) use of insulin: Secondary | ICD-10-CM | POA: Diagnosis not present

## 2022-11-14 ENCOUNTER — Other Ambulatory Visit (HOSPITAL_COMMUNITY): Payer: Self-pay

## 2022-11-17 DIAGNOSIS — Z794 Long term (current) use of insulin: Secondary | ICD-10-CM | POA: Diagnosis not present

## 2022-11-17 DIAGNOSIS — E109 Type 1 diabetes mellitus without complications: Secondary | ICD-10-CM | POA: Diagnosis not present

## 2022-11-17 DIAGNOSIS — E1065 Type 1 diabetes mellitus with hyperglycemia: Secondary | ICD-10-CM | POA: Diagnosis not present

## 2023-01-30 ENCOUNTER — Other Ambulatory Visit (HOSPITAL_COMMUNITY): Payer: Self-pay

## 2023-01-30 MED ORDER — AMPHETAMINE SULFATE 10 MG PO TABS
2.0000 | ORAL_TABLET | Freq: Every day | ORAL | 0 refills | Status: DC
  Filled 2023-01-30: qty 60, 30d supply, fill #0

## 2023-01-31 ENCOUNTER — Other Ambulatory Visit (HOSPITAL_COMMUNITY): Payer: Self-pay

## 2023-02-02 ENCOUNTER — Other Ambulatory Visit (HOSPITAL_COMMUNITY): Payer: Self-pay

## 2023-02-23 DIAGNOSIS — E1065 Type 1 diabetes mellitus with hyperglycemia: Secondary | ICD-10-CM | POA: Diagnosis not present

## 2023-02-23 DIAGNOSIS — E109 Type 1 diabetes mellitus without complications: Secondary | ICD-10-CM | POA: Diagnosis not present

## 2023-02-23 DIAGNOSIS — Z794 Long term (current) use of insulin: Secondary | ICD-10-CM | POA: Diagnosis not present

## 2023-03-07 ENCOUNTER — Other Ambulatory Visit (HOSPITAL_COMMUNITY): Payer: Self-pay

## 2023-03-22 ENCOUNTER — Encounter (HOSPITAL_COMMUNITY): Payer: Self-pay

## 2023-03-22 ENCOUNTER — Other Ambulatory Visit (HOSPITAL_COMMUNITY): Payer: Self-pay

## 2023-03-22 DIAGNOSIS — F819 Developmental disorder of scholastic skills, unspecified: Secondary | ICD-10-CM | POA: Diagnosis not present

## 2023-03-22 DIAGNOSIS — F411 Generalized anxiety disorder: Secondary | ICD-10-CM | POA: Diagnosis not present

## 2023-03-22 DIAGNOSIS — F900 Attention-deficit hyperactivity disorder, predominantly inattentive type: Secondary | ICD-10-CM | POA: Diagnosis not present

## 2023-03-22 MED ORDER — AMPHETAMINE SULFATE 10 MG PO TABS
30.0000 mg | ORAL_TABLET | Freq: Every day | ORAL | 0 refills | Status: DC
  Filled 2023-03-22: qty 90, 30d supply, fill #0

## 2023-03-23 ENCOUNTER — Telehealth: Payer: BC Managed Care – PPO | Admitting: Internal Medicine

## 2023-03-23 NOTE — Progress Notes (Unsigned)
Name: Kiara Greer  MRN/ DOB: [id number], Dec 12, 2000   Age/ Sex: 22 y.o., female    PCP: Patient, No Pcp Per   Reason for Endocrinology Evaluation: Type 1 Diabetes Mellitus     Date of Initial Endocrinology Visit: 07/25/2022    PATIENT IDENTIFIER: Kiara Greer is a 22 y.o. female with a past medical history of T1DM, hypothyroid, ADHD, celiac disease. The patient presented for initial endocrinology clinic visit on 07/25/2022 for consultative assistance with her diabetes management.    HPI: Kiara Greer was    Diagnosed with DM  at age 65  Prior Medications tried/Intolerance: she was started on Medtronic 2016 , switched to Tandem 11/2018 Hemoglobin A1c has ranged from 6.8% in 2012, peaking at 11.4% in 2020.    THYROID HISTORY:  She was diagnosed with hashimoto's thyroiditis at 38 months of age.   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  She is accompanied by her mother today she checks her blood sugars multiple times daily. The patient has not had hypoglycemic episodes since the last clinic visit, but has been noted with tight BG's overnight.    She eats 2 meals a day ( breakfast and supper) snacks for lunch   Denies constipation or diarrhea  Denies local neck swelling  Denies nausea    This patient with type 1 diabetes is treated with Tandem  (insulin pump). During the visit the pump basal and bolus doses were reviewed including carb/insulin rations and supplemental doses. The clinical list was updated. The glucose meter download was reviewed in detail to determine if the current pump settings are providing the best glycemic control without excessive hypoglycemia.  Pump and meter download:     Pump   tandem Settings   Insulin type   novolog   Basal rate       0000  0.9 u/h     0600 1.250   0800 1.300   1200 1.400   2200 1.350          I:C ratio       0000 1:1 Enter #4g with each meal/#2 with snacks                  Sensitivity       0000  35      Goal       0000  110-120            Type & Model of Pump: tandem Insulin Type: Currently using novolog .  There is no height or weight on file to calculate BMI.  PUMP STATISTICS: Average BG: 202 Average Daily Carbs (g): 2 Average Total Daily Insulin: 46.53 Average Daily Basal:34 (64 %) Average Daily Bolus: 37 (19 %)     HOME DIABETES REGIMEN: NovoLog Synthroid 137     Statin: No ACE-I/ARB: No Prior Diabetic Education: Yes   CONTINUOUS GLUCOSE MONITORING RECORD INTERPRETATION    Dates of Recording:7/12-7/25/2024  Sensor description:dexcom  Results statistics:   CGM use % of time 27  Average and SD 202  Time in range 81    %  % Time Above 180 18  % Time Below target 1   Glycemic patterns summary: Bg's optimal at night but trend up during the day   Hyperglycemic episodes  postprandial   Hypoglycemic episodes occurred  at night  Overnight periods: trends down     DIABETIC COMPLICATIONS: Microvascular complications:   Denies: CKD, retinopathy Last eye exam: Completed 07/10/2022  Macrovascular complications:   Denies: CAD, PVD, CVA   PAST HISTORY: Past Medical History:  Past Medical History:  Diagnosis Date   ADHD (attention deficit hyperactivity disorder)    Celiac disease    Diabetes mellitus type I (HCC)    Duanes syndrome    Goiter    Hashimoto's thyroiditis    Hyperlipidemia type II    Hypoglycemia associated with diabetes (HCC)    Hypoglycemia unawareness in type 1 diabetes mellitus (HCC)    Hypothyroidism, acquired, autoimmune    Past Surgical History:  Past Surgical History:  Procedure Laterality Date   NO PAST SURGERIES      Social History:  reports that she has never smoked. She has never used smokeless tobacco. She reports current  alcohol use. She reports that she does not use drugs. Family History:  Family History  Problem Relation Age of Onset   Diabetes Mother    Thyroid disease Mother    Celiac disease Mother    Diabetes Sister    Thyroid disease Sister    Celiac disease Sister    Diabetes Maternal Uncle    Thyroid disease Maternal Grandmother    Celiac disease Maternal Grandmother    Cancer Neg Hx      HOME MEDICATIONS: Allergies as of 03/23/2023   No Known Allergies      Medication List        Accurate as of March 23, 2023  7:31 AM. If you have any questions, ask your nurse or doctor.          Amphetamine Sulfate 10 MG Tabs Commonly known as: Evekeo Take 1-2 tablets (10-20 mg total) by mouth 2 (two) times daily.   Amphetamine Sulfate 10 MG Tabs Take 3 tablets (30 mg total) by mouth daily.   Baqsimi Two Pack 3 MG/DOSE Powd Generic drug: Glucagon Place 1 application  into the nose as needed. Use as directed if unconscious, unable to take food by mouth, or having a seizure due to hypoglycemia   Calcium-Vitamin D-Vitamin K 500-100-40 MG-UNT-MCG Chew Chew by mouth.   cetirizine 10 MG tablet Commonly known as: ZYRTEC Take 10 mg by mouth as needed for allergies.   Dexcom G7 Sensor Misc Place a new Dexcom G7 sensor every 10 days.   escitalopram 20 MG tablet Commonly known as: LEXAPRO Take 1 tablet (20 mg total) by mouth daily.   Hailey FE 1/20 1-20 MG-MCG tablet Generic drug: norethindrone-ethinyl estradiol-FE TAKE 1 TABLET DAILY   insulin aspart 100 UNIT/ML injection Commonly known as: NovoLOG Inject 60 Units into the skin continuous. Max daily 60 units per pump   multivitamin tablet Take 1 tablet by mouth daily.   rosuvastatin 10 MG tablet Commonly known as: CRESTOR TAKE 1 TABLET DAILY   Synthroid 137 MCG tablet Generic drug: levothyroxine Take 1 tablet (137 mcg total) by mouth daily.         ALLERGIES: No Known Allergies   REVIEW OF SYSTEMS: A  comprehensive ROS was conducted with the patient and is negative except as per HPI  OBJECTIVE:   VITAL SIGNS: There were no vitals taken for this visit.   PHYSICAL EXAM:  General: Pt appears well and is in NAD  Lungs: Clear with good BS bilat with no rales, rhonchi, or wheezes  Heart: RRR   Abdomen:  soft, nontender  Extremities:  Lower extremities - No pretibial edema  Neuro: MS is good with appropriate affect, pt is alert and Ox3    DM foot exam: 07/25/2022  The skin of the feet is intact without sores or ulcerations. The pedal pulses are 2+ on right and 2+ on left. The sensation is intact to a screening 5.07, 10 gram monofilament bilaterally   DATA REVIEWED:  Lab Results  Component Value Date   HGBA1C 10.0 (A) 10/26/2022   HGBA1C 9.3 (A) 07/26/2022   HGBA1C 11.1 11/23/2021    Latest Reference Range & Units 07/25/22 12:03  Sodium 135 - 145 mEq/L 136  Potassium 3.5 - 5.1 mEq/L 4.5  Chloride 96 - 112 mEq/L 102  CO2 19 - 32 mEq/L 27  Glucose 70 - 99 mg/dL 034 (H)  BUN 6 - 23 mg/dL 8  Creatinine 7.42 - 5.95 mg/dL 6.38  Calcium 8.4 - 75.6 mg/dL 9.9  GFR >43.32 mL/min 122.54    Latest Reference Range & Units 07/25/22 12:03  Total CHOL/HDL Ratio  3  Cholesterol 0 - 200 mg/dL 951  HDL Cholesterol >88.41 mg/dL 66.06  LDL (calc) 0 - 99 mg/dL 90  NonHDL  301.60  Triglycerides 0.0 - 149.0 mg/dL 109.3  VLDL 0.0 - 23.5 mg/dL 57.3    Latest Reference Range & Units 07/25/22 12:03  TSH 0.35 - 5.50 uIU/mL 2.32     ASSESSMENT / PLAN / RECOMMENDATIONS:   1) Type 1 Diabetes Mellitus, Poorly controlled, Without complications - Most recent A1c of 10.0 %. Goal A1c < 7.0 %.     -Patient continues with worsening glycemic control -In reviewing her pump/CGM data, the patient does have tight BG's overnight but continues with hypoglycemia during the day, she has not been bolusing for meals as previously advised, patient was again encouraged to enter #4 grams of carbohydrates with each  meal, as she does not count carbohydrates, and 2 g of carbohydrates with snacks -I have also decreased her basal rate at midnight to prevent hypoglycemia -Her pump is out of warranty, she will contact tandem to get a replacement  -Preconception counseling was done on her initial visit 07/2022  MEDICATIONS: NovoLog   Pump   tandem Settings   Insulin type   novolog   Basal rate       0000  0.9 u/h    0600 1.250   0800 1.300   1200 1.400   2200 1.350          I:C ratio       0000 1:1 Enter #4g with each meal/#2 with snacks                  Sensitivity       0000  35      Goal       0000  110-120        EDUCATION / INSTRUCTIONS: BG monitoring instructions: Patient is instructed to check her blood sugars 3 times a day, before each meal. Call [facility] if: BG persistently < 70  I reviewed the Rule of 15 for the treatment of hypoglycemia in detail with the patient. Literature supplied.   2) Diabetic complications:  Eye: Does not have known diabetic retinopathy.  Neuro/ Feet: Does not have known diabetic peripheral neuropathy. Renal: Patient does not have known baseline CKD. She is not on an ACEI/ARB at present.  3) Hashimoto's thyroiditis:  -Patient is clinically euthyroid -TSH has been normal -No change  Medication  Continue Synthroid 137 mcg  daily   4)Dyslipidemia:  -Patient is on statins through her previous provider, which we continued -Lipid panel acceptable  Medication Continue rosuvastatin 10 mg daily    Follow-up in 4 months  Signed electronically by: Lyndle Herrlich, MD  [facility]  [facility] [address] [place] [place], [place] [address] Phone: [phone] FAX: [phone]   CC: Patient, No Pcp Per No address on file Phone: None  Fax: None    Return to Endocrinology clinic as below: Future Appointments  Date Time Provider Department Center  03/23/2023 12:10 PM  Kiara Greer, Konrad Dolores, MD [facility] None

## 2023-04-10 DIAGNOSIS — R5383 Other fatigue: Secondary | ICD-10-CM | POA: Diagnosis not present

## 2023-04-10 DIAGNOSIS — R6883 Chills (without fever): Secondary | ICD-10-CM | POA: Diagnosis not present

## 2023-04-10 DIAGNOSIS — J101 Influenza due to other identified influenza virus with other respiratory manifestations: Secondary | ICD-10-CM | POA: Diagnosis not present

## 2023-04-10 DIAGNOSIS — E109 Type 1 diabetes mellitus without complications: Secondary | ICD-10-CM | POA: Diagnosis not present

## 2023-04-18 ENCOUNTER — Encounter (INDEPENDENT_AMBULATORY_CARE_PROVIDER_SITE_OTHER): Payer: Self-pay

## 2023-04-24 ENCOUNTER — Other Ambulatory Visit (HOSPITAL_COMMUNITY): Payer: Self-pay

## 2023-04-27 ENCOUNTER — Other Ambulatory Visit (HOSPITAL_COMMUNITY): Payer: Self-pay

## 2023-04-27 ENCOUNTER — Encounter: Payer: Self-pay | Admitting: Internal Medicine

## 2023-04-27 ENCOUNTER — Other Ambulatory Visit: Payer: Self-pay

## 2023-04-27 DIAGNOSIS — E785 Hyperlipidemia, unspecified: Secondary | ICD-10-CM

## 2023-04-27 DIAGNOSIS — E063 Autoimmune thyroiditis: Secondary | ICD-10-CM

## 2023-04-27 DIAGNOSIS — E1065 Type 1 diabetes mellitus with hyperglycemia: Secondary | ICD-10-CM

## 2023-04-27 MED ORDER — ROSUVASTATIN CALCIUM 10 MG PO TABS
10.0000 mg | ORAL_TABLET | Freq: Every day | ORAL | 3 refills | Status: DC
  Filled 2023-04-27: qty 90, 90d supply, fill #0
  Filled 2023-07-27: qty 90, 90d supply, fill #1
  Filled 2023-10-15: qty 90, 90d supply, fill #2
  Filled 2023-12-10: qty 90, 90d supply, fill #3

## 2023-04-27 MED ORDER — DEXCOM G7 SENSOR MISC
3 refills | Status: DC
  Filled 2023-04-27: qty 9, 90d supply, fill #0
  Filled 2023-07-27: qty 3, 30d supply, fill #1
  Filled 2023-07-31: qty 9, 90d supply, fill #1
  Filled 2023-09-05 – 2023-10-15 (×2): qty 9, 90d supply, fill #2
  Filled 2023-12-10 – 2024-03-28 (×2): qty 9, 90d supply, fill #3

## 2023-04-27 MED ORDER — SYNTHROID 137 MCG PO TABS
137.0000 ug | ORAL_TABLET | Freq: Every day | ORAL | 3 refills | Status: DC
  Filled 2023-04-27: qty 90, 90d supply, fill #0

## 2023-04-27 MED ORDER — INSULIN ASPART 100 UNIT/ML IJ SOLN
60.0000 [IU] | INTRAMUSCULAR | 3 refills | Status: DC
  Filled 2023-04-27: qty 60, 90d supply, fill #0
  Filled 2023-07-27: qty 60, 90d supply, fill #1
  Filled 2023-09-05 – 2023-10-15 (×2): qty 60, 90d supply, fill #2

## 2023-04-28 ENCOUNTER — Other Ambulatory Visit (HOSPITAL_COMMUNITY): Payer: Self-pay

## 2023-04-28 MED ORDER — AMPHETAMINE SULFATE 10 MG PO TABS
10.0000 mg | ORAL_TABLET | Freq: Three times a day (TID) | ORAL | 0 refills | Status: DC
  Filled 2023-04-28: qty 25, 9d supply, fill #0
  Filled 2023-05-02: qty 245, 81d supply, fill #0

## 2023-04-30 ENCOUNTER — Other Ambulatory Visit (HOSPITAL_COMMUNITY): Payer: Self-pay

## 2023-04-30 ENCOUNTER — Other Ambulatory Visit: Payer: Self-pay

## 2023-05-01 ENCOUNTER — Other Ambulatory Visit (HOSPITAL_COMMUNITY): Payer: Self-pay

## 2023-05-02 ENCOUNTER — Other Ambulatory Visit (HOSPITAL_COMMUNITY): Payer: Self-pay

## 2023-05-02 ENCOUNTER — Other Ambulatory Visit: Payer: Self-pay

## 2023-05-03 ENCOUNTER — Telehealth: Payer: Self-pay

## 2023-05-03 ENCOUNTER — Other Ambulatory Visit (HOSPITAL_COMMUNITY): Payer: Self-pay

## 2023-05-03 NOTE — Telephone Encounter (Signed)
Pharmacy Patient Advocate Encounter   Received notification from Pt Calls Messages that prior authorization for Synthroid is required/requested.   Insurance verification completed.   The patient is insured through [facility] .   Per test claim: PA required; PA started via CoverMyMeds. KEY [id number] . Please see clinical question(s) below that I am not finding the answer to in her chart and advise.

## 2023-05-03 NOTE — Telephone Encounter (Signed)
Dexcom and Synthroid need PA

## 2023-05-03 NOTE — Telephone Encounter (Signed)
Pharmacy Patient Advocate Encounter   Received notification from Pt Calls Messages that prior authorization for Dexcom G7 sensor is required/requested.   Insurance verification completed.   The patient is insured through [employer] [facility] [employer] .   Per test claim: PA required; PA submitted to above mentioned insurance via CoverMyMeds Key/confirmation #/EOC [id number] Status is pending

## 2023-05-07 NOTE — Telephone Encounter (Signed)
Patient will check with her dad and call me back as she can't remember is she has tried the generic

## 2023-05-10 ENCOUNTER — Other Ambulatory Visit (HOSPITAL_COMMUNITY): Payer: Self-pay

## 2023-05-10 ENCOUNTER — Other Ambulatory Visit: Payer: Self-pay

## 2023-05-17 ENCOUNTER — Other Ambulatory Visit: Payer: Self-pay

## 2023-05-17 DIAGNOSIS — E063 Autoimmune thyroiditis: Secondary | ICD-10-CM

## 2023-05-17 MED ORDER — SYNTHROID 137 MCG PO TABS: 137.0000 ug | ORAL_TABLET | Freq: Every day | ORAL | 3 refills | Status: DC

## 2023-05-21 NOTE — Telephone Encounter (Signed)
 Pharmacy Patient Advocate Encounter  Received notification from [facility] that Prior Authorization for Dexcom G7 sensor has been APPROVED through 05/02/2024   PA #/Case ID/Reference #: [id number]

## 2023-06-28 ENCOUNTER — Other Ambulatory Visit (HOSPITAL_COMMUNITY): Payer: Self-pay

## 2023-06-28 DIAGNOSIS — F411 Generalized anxiety disorder: Secondary | ICD-10-CM | POA: Diagnosis not present

## 2023-06-28 DIAGNOSIS — F819 Developmental disorder of scholastic skills, unspecified: Secondary | ICD-10-CM | POA: Diagnosis not present

## 2023-06-28 DIAGNOSIS — F900 Attention-deficit hyperactivity disorder, predominantly inattentive type: Secondary | ICD-10-CM | POA: Diagnosis not present

## 2023-06-28 MED ORDER — AMPHETAMINE SULFATE 10 MG PO TABS
10.0000 mg | ORAL_TABLET | Freq: Three times a day (TID) | ORAL | 0 refills | Status: AC
  Filled 2023-07-27: qty 270, 90d supply, fill #0

## 2023-06-29 DIAGNOSIS — E1065 Type 1 diabetes mellitus with hyperglycemia: Secondary | ICD-10-CM | POA: Diagnosis not present

## 2023-06-29 DIAGNOSIS — E109 Type 1 diabetes mellitus without complications: Secondary | ICD-10-CM | POA: Diagnosis not present

## 2023-06-29 DIAGNOSIS — Z794 Long term (current) use of insulin: Secondary | ICD-10-CM | POA: Diagnosis not present

## 2023-06-30 ENCOUNTER — Other Ambulatory Visit (HOSPITAL_COMMUNITY): Payer: Self-pay

## 2023-07-10 ENCOUNTER — Encounter (INDEPENDENT_AMBULATORY_CARE_PROVIDER_SITE_OTHER): Payer: Self-pay

## 2023-07-12 ENCOUNTER — Other Ambulatory Visit (HOSPITAL_COMMUNITY): Payer: Self-pay

## 2023-07-23 ENCOUNTER — Encounter (INDEPENDENT_AMBULATORY_CARE_PROVIDER_SITE_OTHER): Payer: Self-pay

## 2023-07-27 ENCOUNTER — Other Ambulatory Visit (HOSPITAL_COMMUNITY): Payer: Self-pay

## 2023-07-27 ENCOUNTER — Other Ambulatory Visit: Payer: Self-pay

## 2023-07-28 ENCOUNTER — Other Ambulatory Visit (HOSPITAL_COMMUNITY): Payer: Self-pay

## 2023-07-30 ENCOUNTER — Other Ambulatory Visit: Payer: Self-pay

## 2023-07-30 ENCOUNTER — Other Ambulatory Visit (HOSPITAL_COMMUNITY): Payer: Self-pay

## 2023-07-31 ENCOUNTER — Other Ambulatory Visit (HOSPITAL_COMMUNITY): Payer: Self-pay

## 2023-09-04 ENCOUNTER — Other Ambulatory Visit (HOSPITAL_COMMUNITY): Payer: Self-pay

## 2023-09-05 ENCOUNTER — Other Ambulatory Visit (HOSPITAL_COMMUNITY): Payer: Self-pay

## 2023-09-05 ENCOUNTER — Other Ambulatory Visit: Payer: Self-pay

## 2023-10-04 ENCOUNTER — Telehealth: Payer: Self-pay | Admitting: Internal Medicine

## 2023-10-04 NOTE — Telephone Encounter (Signed)
 Please contact the patient and schedule her for follow-up as she has not been seen in 6 months.  Her appointment in September is too far out   Thanks

## 2023-10-04 NOTE — Telephone Encounter (Signed)
 Patient states that at this time she cannot come in sooner than her September 2025 appointment, but will call if schedule changes.

## 2023-10-15 ENCOUNTER — Other Ambulatory Visit (HOSPITAL_COMMUNITY): Payer: Self-pay

## 2023-10-15 ENCOUNTER — Other Ambulatory Visit: Payer: Self-pay

## 2023-10-17 ENCOUNTER — Other Ambulatory Visit (HOSPITAL_COMMUNITY): Payer: Self-pay

## 2023-10-19 ENCOUNTER — Other Ambulatory Visit (HOSPITAL_COMMUNITY): Payer: Self-pay

## 2023-11-29 DIAGNOSIS — E109 Type 1 diabetes mellitus without complications: Secondary | ICD-10-CM | POA: Diagnosis not present

## 2023-11-29 DIAGNOSIS — E1065 Type 1 diabetes mellitus with hyperglycemia: Secondary | ICD-10-CM | POA: Diagnosis not present

## 2023-11-29 DIAGNOSIS — Z794 Long term (current) use of insulin: Secondary | ICD-10-CM | POA: Diagnosis not present

## 2023-12-11 ENCOUNTER — Other Ambulatory Visit (HOSPITAL_COMMUNITY): Payer: Self-pay

## 2023-12-12 ENCOUNTER — Other Ambulatory Visit: Payer: Self-pay | Admitting: Internal Medicine

## 2023-12-12 ENCOUNTER — Encounter: Payer: Self-pay | Admitting: Internal Medicine

## 2023-12-12 ENCOUNTER — Other Ambulatory Visit

## 2023-12-12 ENCOUNTER — Other Ambulatory Visit (HOSPITAL_COMMUNITY): Payer: Self-pay

## 2023-12-12 DIAGNOSIS — F819 Developmental disorder of scholastic skills, unspecified: Secondary | ICD-10-CM | POA: Diagnosis not present

## 2023-12-12 DIAGNOSIS — E1069 Type 1 diabetes mellitus with other specified complication: Secondary | ICD-10-CM | POA: Diagnosis not present

## 2023-12-12 DIAGNOSIS — F411 Generalized anxiety disorder: Secondary | ICD-10-CM | POA: Diagnosis not present

## 2023-12-12 DIAGNOSIS — F900 Attention-deficit hyperactivity disorder, predominantly inattentive type: Secondary | ICD-10-CM | POA: Diagnosis not present

## 2023-12-12 DIAGNOSIS — E1065 Type 1 diabetes mellitus with hyperglycemia: Secondary | ICD-10-CM

## 2023-12-12 DIAGNOSIS — E785 Hyperlipidemia, unspecified: Secondary | ICD-10-CM | POA: Diagnosis not present

## 2023-12-12 DIAGNOSIS — E063 Autoimmune thyroiditis: Secondary | ICD-10-CM | POA: Diagnosis not present

## 2023-12-12 MED ORDER — ESCITALOPRAM OXALATE 20 MG PO TABS
20.0000 mg | ORAL_TABLET | Freq: Every day | ORAL | 3 refills | Status: AC
  Filled 2023-12-12: qty 90, 90d supply, fill #0
  Filled 2024-03-05: qty 90, 90d supply, fill #1
  Filled 2024-04-01: qty 90, 90d supply, fill #2

## 2023-12-12 MED ORDER — AMPHETAMINE SULFATE 10 MG PO TABS
10.0000 mg | ORAL_TABLET | Freq: Three times a day (TID) | ORAL | 0 refills | Status: DC
  Filled 2023-12-12: qty 270, 90d supply, fill #0

## 2023-12-13 ENCOUNTER — Ambulatory Visit: Admitting: Internal Medicine

## 2023-12-13 ENCOUNTER — Encounter: Payer: Self-pay | Admitting: Internal Medicine

## 2023-12-13 ENCOUNTER — Other Ambulatory Visit (HOSPITAL_COMMUNITY): Payer: Self-pay

## 2023-12-13 VITALS — BP 122/70 | HR 84 | Ht 65.0 in | Wt 146.8 lb

## 2023-12-13 DIAGNOSIS — E1029 Type 1 diabetes mellitus with other diabetic kidney complication: Secondary | ICD-10-CM

## 2023-12-13 DIAGNOSIS — E063 Autoimmune thyroiditis: Secondary | ICD-10-CM | POA: Diagnosis not present

## 2023-12-13 DIAGNOSIS — Z794 Long term (current) use of insulin: Secondary | ICD-10-CM

## 2023-12-13 DIAGNOSIS — Z9641 Presence of insulin pump (external) (internal): Secondary | ICD-10-CM

## 2023-12-13 DIAGNOSIS — E1065 Type 1 diabetes mellitus with hyperglycemia: Secondary | ICD-10-CM | POA: Diagnosis not present

## 2023-12-13 DIAGNOSIS — E1069 Type 1 diabetes mellitus with other specified complication: Secondary | ICD-10-CM | POA: Diagnosis not present

## 2023-12-13 DIAGNOSIS — E785 Hyperlipidemia, unspecified: Secondary | ICD-10-CM

## 2023-12-13 DIAGNOSIS — R809 Proteinuria, unspecified: Secondary | ICD-10-CM

## 2023-12-13 LAB — BASIC METABOLIC PANEL WITH GFR
BUN: 9 mg/dL (ref 7–25)
CO2: 28 mmol/L (ref 20–32)
Calcium: 10.5 mg/dL — ABNORMAL HIGH (ref 8.6–10.2)
Chloride: 101 mmol/L (ref 98–110)
Creat: 0.71 mg/dL (ref 0.50–0.96)
Glucose, Bld: 88 mg/dL (ref 65–99)
Potassium: 4.4 mmol/L (ref 3.5–5.3)
Sodium: 138 mmol/L (ref 135–146)
eGFR: 122 mL/min/1.73m2 (ref >=60)

## 2023-12-13 LAB — LIPID PANEL
Cholesterol: 311 mg/dL — ABNORMAL HIGH (ref <200)
HDL: 93 mg/dL (ref >=50)
LDL Cholesterol (Calc): 202 mg/dL — ABNORMAL HIGH
Non-HDL Cholesterol (Calc): 218 mg/dL — ABNORMAL HIGH (ref <130)
Total CHOL/HDL Ratio: 3.3 (calc) (ref <5.0)
Triglycerides: 62 mg/dL (ref <150)

## 2023-12-13 LAB — HEMOGLOBIN A1C
Hgb A1c MFr Bld: 11.4 % — ABNORMAL HIGH (ref <5.7)
Mean Plasma Glucose: 280 mg/dL
eAG (mmol/L): 15.5 mmol/L

## 2023-12-13 LAB — MICROALBUMIN / CREATININE URINE RATIO
Creatinine, Urine: 11 mg/dL — ABNORMAL LOW (ref 20–275)
Microalb Creat Ratio: 109 mg/g{creat} — ABNORMAL HIGH (ref <30)
Microalb, Ur: 1.2 mg/dL

## 2023-12-13 LAB — TSH: TSH: 4.64 m[IU]/L — ABNORMAL HIGH

## 2023-12-13 LAB — T4, FREE: Free T4: 1.1 ng/dL (ref 0.8–1.8)

## 2023-12-13 MED ORDER — INSULIN ASPART 100 UNIT/ML IJ SOLN
60.0000 [IU] | INTRAMUSCULAR | 3 refills | Status: AC
  Filled 2023-12-13 – 2024-03-28 (×2): qty 60, 90d supply, fill #0

## 2023-12-13 MED ORDER — ROSUVASTATIN CALCIUM 20 MG PO TABS
20.0000 mg | ORAL_TABLET | Freq: Every day | ORAL | 3 refills | Status: AC
  Filled 2023-12-13: qty 90, 90d supply, fill #0
  Filled 2024-03-05: qty 90, 90d supply, fill #1
  Filled 2024-05-01: qty 90, 90d supply, fill #2

## 2023-12-13 MED ORDER — LISINOPRIL 2.5 MG PO TABS
2.5000 mg | ORAL_TABLET | Freq: Every day | ORAL | 3 refills | Status: AC
  Filled 2023-12-13: qty 90, 90d supply, fill #0
  Filled 2024-03-05: qty 90, 90d supply, fill #1
  Filled 2024-04-19: qty 90, 90d supply, fill #2

## 2023-12-13 MED ORDER — SYNTHROID 150 MCG PO TABS
150.0000 ug | ORAL_TABLET | Freq: Every day | ORAL | 2 refills | Status: DC
  Filled 2023-12-13: qty 90, 90d supply, fill #0
  Filled 2024-03-05: qty 90, 90d supply, fill #1

## 2023-12-13 NOTE — Patient Instructions (Signed)
 Enter #4 grams of carbohydrates before each meal  Enter # 2 grams with a snack      - HOW TO TREAT LOW BLOOD SUGARS (Blood sugar LESS THAN 70 MG/DL) Please follow the RULE OF 15 for the treatment of hypoglycemia treatment (when your (blood sugars are less than 70 mg/dL)   STEP 1: Take 15 grams of carbohydrates when your blood sugar is low, which includes:  3-4 GLUCOSE TABS  OR 3-4 OZ OF JUICE OR REGULAR SODA OR ONE TUBE OF GLUCOSE GEL    STEP 2: RECHECK blood sugar in 15 MINUTES STEP 3: If your blood sugar is still low at the 15 minute recheck --> then, go back to STEP 1 and treat AGAIN with another 15 grams of carbohydrates.

## 2023-12-13 NOTE — Progress Notes (Signed)
 Name: Kiara Greer  MRN/ DOB: [id number], 05/08/2000   Age/ Sex: 23 y.o., female    PCP: Patient, No Pcp Per   Reason for Endocrinology Evaluation: Type 1 Diabetes Mellitus     Date of Initial Endocrinology Visit: 07/25/2022    PATIENT IDENTIFIER: [name] is a 23 y.o. female with a past medical history of T1DM, hypothyroid, ADHD, celiac disease. The patient presented for initial endocrinology clinic visit on 07/25/2022 for consultative assistance with her diabetes management.    HPI: [name] was    Diagnosed with DM  at age 23  Prior Medications tried/Intolerance: she was started on Medtronic 2016 , switched to Tandem 11/2018 Hemoglobin A1c has ranged from 6.8% in 2012, peaking at 11.4% in 2020.   THYROID  HISTORY:  She was diagnosed with hashimoto's thyroiditis at 59 months of age.   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  She has NOT been to our clinic in 14 months.  She checks her blood sugars multiple times daily. The patient has not had hypoglycemic episodes since the last clinic visit, but has been noted with tight BG's overnight.  No local neck swelling  No palpitations  No nausea  No constipation or diarrhea  On gluten free diet  She did have issues with Dexcom and having to use fingerstick   Moving to [place] next month for work  She is on COC    She has been forgetting to take rosuvastatin    This patient with type 1 diabetes is treated with Tandem  (insulin  pump). During the visit the pump basal and bolus doses were reviewed including carb/insulin  rations and supplemental doses. The clinical list was updated. The glucose meter download was reviewed in detail to determine if the current pump settings are providing the best glycemic control without excessive  hypoglycemia.  Pump and meter download:     Pump   tandem Settings  SF  Insulin  type   novolog     Basal rate        0000  0.725 u/h  45   0600 1.250 60   0800 1.400 60   1200 1.350 60   2200 1.400 35            I:C ratio        0000 1:1 Enter #4g with each meal                  Goal        0000  120             Type & Model of Pump: tandem Insulin  Type: Currently using novolog  .  There is no height or weight on file to calculate BMI.  PUMP STATISTICS: Average BG: 208 Average Daily Carbs (g): 3 Average Total Daily Insulin : 54.99 Average Daily Basal: 35.24 (64 %) Average Daily Bolus: 19.75 (36 %)     HOME DIABETES REGIMEN: NovoLog  Synthroid  137 mcg daily      Statin: No ACE-I/ARB: No Prior Diabetic Education: Yes   CONTINUOUS GLUCOSE MONITORING RECORD INTERPRETATION    Dates of Recording:8/29-9/02/2024  Sensor description:dexcom  Results statistics:   CGM use % of time 72  Average and SD 208/98  Time in range 50  %  % Time Above 180 49  % Time Below target 1.2  Glycemic patterns summary: BGs are optimal overnight and fluctuate during the day  Hyperglycemic episodes  postprandial   Hypoglycemic episodes occurred rarely at night  Overnight periods: trends down     DIABETIC COMPLICATIONS: Microvascular complications:   Denies: CKD, retinopathy Last eye exam: Completed 07/10/2022  Macrovascular complications:   Denies: CAD, PVD, CVA   PAST HISTORY: Past Medical History:  Past Medical History:  Diagnosis Date   ADHD (attention deficit hyperactivity disorder)    Celiac disease    Diabetes mellitus type I (HCC)    Duanes syndrome    Goiter    Hashimoto's thyroiditis    Hyperlipidemia type II    Hypoglycemia associated with diabetes (HCC)    Hypoglycemia unawareness in type 1 diabetes mellitus (HCC)    Hypothyroidism, acquired, autoimmune    Past Surgical History:  Past Surgical History:  Procedure Laterality Date    NO PAST SURGERIES      Social History:  reports that she has never smoked. She has never used smokeless tobacco. She reports current alcohol use. She reports that she does not use drugs. Family History:  Family History  Problem Relation Age of Onset   Diabetes Mother    Thyroid  disease Mother    Celiac disease Mother    Diabetes Sister    Thyroid  disease Sister    Celiac disease Sister    Diabetes Maternal Uncle    Thyroid  disease Maternal Grandmother    Celiac disease Maternal Grandmother    Cancer Neg Hx      HOME MEDICATIONS: Allergies as of 12/13/2023   No Known Allergies      Medication List        Accurate as of December 13, 2023  7:24 AM. If you have any questions, ask your nurse or doctor.          Amphetamine  Sulfate 10 MG Tabs Commonly known as: Evekeo  Take 1-2 tablets (10-20 mg total) by mouth 2 (two) times daily.   Amphetamine  Sulfate 10 MG Tabs Take 1 tablet (10 mg total) by mouth 3 (three) times daily.   Amphetamine  Sulfate 10 MG Tabs Take 1 tablet (10 mg total) by mouth 3 (three) times daily.   Baqsimi  Two Pack 3 MG/DOSE Powd Generic drug: Glucagon  Place 1 application  into the nose as needed. Use as directed if unconscious, unable to take food by mouth, or having a seizure due to hypoglycemia   Calcium -Vitamin D-Vitamin K 500-100-40 MG-UNT-MCG Chew Chew by mouth.   cetirizine 10 MG tablet Commonly known as: ZYRTEC Take 10 mg by mouth as needed for allergies.   Dexcom G7 Sensor Misc Place a new Dexcom G7 sensor every 10 days.   escitalopram  20 MG tablet Commonly known as: LEXAPRO  Take 1 tablet (20 mg total) by mouth daily.   escitalopram  20 MG tablet Commonly known as: LEXAPRO  Take 1 tablet (20 mg total) by mouth daily.   Hailey FE 1/20 1-20 MG-MCG tablet Generic drug: norethindrone-ethinyl estradiol -FE TAKE 1 TABLET DAILY   multivitamin tablet Take 1 tablet by mouth daily.   NovoLOG  100 UNIT/ML injection Generic drug: insulin   aspart Inject max 60 Units into the skin daily per pump   rosuvastatin  10 MG tablet Commonly known as: CRESTOR  Take 1 tablet (10 mg total) by mouth daily.   Synthroid  137 MCG tablet Generic drug: levothyroxine  Take 1 tablet (137 mcg total) by mouth daily.         ALLERGIES: No Known Allergies   REVIEW OF SYSTEMS: A comprehensive  ROS was conducted with the patient and is negative except as per HPI  OBJECTIVE:   VITAL SIGNS: There were no vitals taken for this visit.   PHYSICAL EXAM:  General: Pt appears well and is in NAD  Thyroid : No nodules or goiter appreciated  Lungs: Clear with good BS bilat   Heart: RRR   Abdomen:  soft, nontender  Extremities:  Lower extremities - No pretibial edema  Neuro: MS is good with appropriate affect, pt is alert and Ox3    DM foot exam: 12/13/2023  The skin of the feet is intact without sores or ulcerations. The pedal pulses are 2+ on right and 2+ on left. The sensation is intact to a screening 5.07, 10 gram monofilament bilaterally   DATA REVIEWED:  Lab Results  Component Value Date   HGBA1C 11.4 (H) 12/12/2023   HGBA1C 10.0 (A) 10/26/2022   HGBA1C 9.3 (A) 07/26/2022      Latest Reference Range & Units 12/12/23 09:18  Sodium 135 - 146 mmol/L 138  Potassium 3.5 - 5.3 mmol/L 4.4  Chloride 98 - 110 mmol/L 101  CO2 20 - 32 mmol/L 28  Glucose 65 - 99 mg/dL 88  Mean Plasma Glucose mg/dL 719  BUN 7 - 25 mg/dL 9  Creatinine 9.49 - 9.03 mg/dL 9.28  Calcium  8.6 - 10.2 mg/dL 89.4 (H)  BUN/Creatinine Ratio 6 - 22 (calc) SEE NOTE:  eGFR > OR = 60 mL/min/1.59m2 122  Total CHOL/HDL Ratio <5.0 (calc) 3.3  Cholesterol <200 mg/dL 688 (H)  HDL Cholesterol > OR = 50 mg/dL 93  LDL Cholesterol (Calc) mg/dL (calc) 797 (H)  MICROALB/CREAT RATIO <30 mg/g creat 109 (H)  Non-HDL Cholesterol (Calc) <130 mg/dL (calc) 781 (H)  Triglycerides <150 mg/dL 62     Latest Reference Range & Units 12/12/23 09:18  eAG (mmol/L) mmol/L 15.5  Glucose  65 - 99 mg/dL 88  Hemoglobin J8R <4.2 % 11.4 (H)  TSH mIU/L 4.64 (H)  T4,Free(Direct) 0.8 - 1.8 ng/dL 1.1    Latest Reference Range & Units 12/12/23 09:18  Microalb, Ur mg/dL 1.2  MICROALB/CREAT RATIO <30 mg/g creat 109 (H)  Creatinine, Urine 20 - 275 mg/dL 11 (L)     Latest Reference Range & Units 07/25/22 12:03  Total CHOL/HDL Ratio  3  Cholesterol 0 - 200 mg/dL 827  HDL Cholesterol >60.99 mg/dL 37.89  LDL (calc) 0 - 99 mg/dL 90  NonHDL  890.41  Triglycerides 0.0 - 149.0 mg/dL 899.9  VLDL 0.0 - 59.9 mg/dL 79.9    Latest Reference Range & Units 07/25/22 12:03  TSH 0.35 - 5.50 uIU/mL 2.32     ASSESSMENT / PLAN / RECOMMENDATIONS:   1) Type 1 Diabetes Mellitus, Poorly controlled, With microalbuminuria complications - Most recent A1c of 11.4 %. Goal A1c < 7.0 %.     -Patient continues with worsening glycemic control -In reviewing CGM/pump download, the patient continues not to bolus with meals on consistent basis.  We again discussed the importance of bolusing with each meal, as most of her hyperglycemia is postprandial -Patient has been noted with hypoglycemia overnight, I will decrease her basal rate at midnight -Patient also has been noted with hypoglycemia with correction I will adjust that as below - I have asked her again to contact tandem, as she is running out of battery sooner than she should     MEDICATIONS: NovoLog     Pump   tandem Settings  SF  Insulin  type   novolog   Basal rate        0000  0.720 u/h  45   0600 1.250 60   0800 1.400 60   1200 1.350 65   2200 1.400 45            I:C ratio        0000 1:1 Enter #4g with each meal                  Goal        0000  120         EDUCATION / INSTRUCTIONS: BG monitoring instructions: Patient is instructed to check her blood sugars 3 times a day, before each meal. Call [facility] if: BG persistently < 70  I reviewed the Rule of 15 for the treatment of hypoglycemia in detail  with the patient. Literature supplied.   2) Diabetic complications:  Eye: Does not have known diabetic retinopathy.  Neuro/ Feet: Does not have known diabetic peripheral neuropathy. Renal: Patient does not have known baseline CKD. She is not on an ACEI/ARB at present.  3) Hashimoto's thyroiditis:  -Patient is clinically euthyroid -TSH slightly elevated -Patient continues with imperfect adherence to levothyroxine  intake -I have again encouraged her to use a pillbox - Will increase as below Medication  Stop Synthroid  137 mcg daily Start Synthroid  150 mcg daily   4)Dyslipidemia:  -Patient with imperfect adherence to rosuvastatin , LDL increased from 90 MGs/DL to 797 MGs/DL - I will increase rosuvastatin  - We discussed increased risk for CVA and CAD with extremely high LDL, she is on a vegetarian diet - I have encouraged patient to use a pillbox   Medication Stop rosuvastatin  10 mg daily Start rosuvastatin  20 mg daily  5) Microalbuminuria:   - This is new - I explained to the patient of improving glycemic control, I have recommended ACE inhibitors to prevent further damage to the kidney - Discussed the risk of progression to CKD  Medication Start lisinopril  2.5 mg daily  Follow-up in 3 months  Signed electronically by: Stefano Redgie Butts, MD  [facility]  [facility] [address] [place] [place], [place] [address] Phone: [phone] FAX: [phone]   CC: Patient, No Pcp Per No address on file Phone: None  Fax: None    Return to Endocrinology clinic as below: Future Appointments  Date Time Provider Department Center  12/13/2023 10:30 AM Brion Hedges, Donell Redgie, MD [facility] None

## 2023-12-14 ENCOUNTER — Ambulatory Visit: Admitting: Internal Medicine

## 2024-02-21 DIAGNOSIS — E1065 Type 1 diabetes mellitus with hyperglycemia: Secondary | ICD-10-CM | POA: Diagnosis not present

## 2024-02-21 DIAGNOSIS — Z794 Long term (current) use of insulin: Secondary | ICD-10-CM | POA: Diagnosis not present

## 2024-02-21 DIAGNOSIS — E109 Type 1 diabetes mellitus without complications: Secondary | ICD-10-CM | POA: Diagnosis not present

## 2024-03-05 ENCOUNTER — Other Ambulatory Visit (HOSPITAL_COMMUNITY): Payer: Self-pay

## 2024-03-05 ENCOUNTER — Other Ambulatory Visit: Payer: Self-pay

## 2024-03-06 ENCOUNTER — Other Ambulatory Visit (HOSPITAL_COMMUNITY): Payer: Self-pay

## 2024-03-11 ENCOUNTER — Other Ambulatory Visit (HOSPITAL_COMMUNITY): Payer: Self-pay

## 2024-03-11 DIAGNOSIS — F411 Generalized anxiety disorder: Secondary | ICD-10-CM | POA: Diagnosis not present

## 2024-03-11 DIAGNOSIS — F819 Developmental disorder of scholastic skills, unspecified: Secondary | ICD-10-CM | POA: Diagnosis not present

## 2024-03-11 DIAGNOSIS — F900 Attention-deficit hyperactivity disorder, predominantly inattentive type: Secondary | ICD-10-CM | POA: Diagnosis not present

## 2024-03-11 MED ORDER — AMPHETAMINE SULFATE 10 MG PO TABS
10.0000 mg | ORAL_TABLET | Freq: Three times a day (TID) | ORAL | 0 refills | Status: DC
  Filled 2024-03-11: qty 270, 90d supply, fill #0

## 2024-03-12 ENCOUNTER — Other Ambulatory Visit (HOSPITAL_COMMUNITY): Payer: Self-pay

## 2024-03-28 ENCOUNTER — Other Ambulatory Visit: Payer: Self-pay

## 2024-03-28 ENCOUNTER — Other Ambulatory Visit (HOSPITAL_COMMUNITY): Payer: Self-pay

## 2024-03-28 ENCOUNTER — Telehealth: Payer: Self-pay

## 2024-03-28 NOTE — Telephone Encounter (Signed)
 Pt called wanting to get labs done prior to her appt on 03/31/24. Lvm for pt to call back.

## 2024-03-29 ENCOUNTER — Other Ambulatory Visit (HOSPITAL_COMMUNITY): Payer: Self-pay

## 2024-03-31 ENCOUNTER — Ambulatory Visit: Admitting: Internal Medicine

## 2024-03-31 ENCOUNTER — Other Ambulatory Visit

## 2024-03-31 ENCOUNTER — Encounter: Payer: Self-pay | Admitting: Internal Medicine

## 2024-03-31 VITALS — BP 120/80 | Ht 65.0 in | Wt 152.0 lb

## 2024-03-31 DIAGNOSIS — E063 Autoimmune thyroiditis: Secondary | ICD-10-CM | POA: Diagnosis not present

## 2024-03-31 DIAGNOSIS — E1065 Type 1 diabetes mellitus with hyperglycemia: Secondary | ICD-10-CM | POA: Diagnosis not present

## 2024-03-31 DIAGNOSIS — E1069 Type 1 diabetes mellitus with other specified complication: Secondary | ICD-10-CM | POA: Diagnosis not present

## 2024-03-31 DIAGNOSIS — E785 Hyperlipidemia, unspecified: Secondary | ICD-10-CM

## 2024-03-31 LAB — POCT GLYCOSYLATED HEMOGLOBIN (HGB A1C): Hemoglobin A1C: 10.5 % — AB (ref 4.0–5.6)

## 2024-03-31 NOTE — Progress Notes (Unsigned)
 " Name: Kiara Greer  MRN/ DOB: [id number], 03/20/2001   Age/ Sex: 23 y.o., female    PCP: Patient, No Pcp Per   Reason for Endocrinology Evaluation: Type 1 Diabetes Mellitus     Date of Initial Endocrinology Visit: 07/25/2022    PATIENT IDENTIFIER: [name] is a 23 y.o. female with a past medical history of T1DM, hypothyroid, ADHD, celiac disease. The patient presented for initial endocrinology clinic visit on 07/25/2022 for consultative assistance with her diabetes management.    HPI: Ms. Guadron was    Diagnosed with DM  at age 73  Prior Medications tried/Intolerance: she was started on Medtronic 2016 , switched to Tandem 11/2018 Hemoglobin A1c has ranged from 6.8% in 2012, peaking at 11.4% in 2020.   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 She checks her blood sugars multiple times daily. The patient has not had hypoglycemic episodes since the last clinic visit, but has been noted with tight BG's overnight.  No local neck swelling  No palpitations No nausea  No constipation or diarrhea  On gluten free diet  She is on COC   This patient with type 1 diabetes is treated with Tandem  (insulin  pump). During the visit the pump basal and bolus doses were reviewed including carb/insulin  rations and supplemental doses. The clinical list was updated. The glucose meter download was reviewed in detail to determine if the current pump settings are providing the best glycemic control without excessive hypoglycemia.  Pump and meter download:      Pump   tandem Settings  SF  Insulin  type   novolog     Basal rate        0000  0.720 u/h  45   0600 1.250 60   0800 1.400 60   1200 1.350 65   2200  1.400 45            I:C ratio        0000 1:1 Enter #4g with each meal                  Goal        0000  120             Type & Model of Pump: tandem Insulin  Type: Currently using novolog  .  Body mass index is 25.29 kg/m.  PUMP STATISTICS: Average BG: 259 Average Daily Carbs (g): 6 Average Total Daily Insulin : 54.14 Average Daily Basal: 32.24 (60 %) Average Daily Bolus: 21.80 (40%)     HOME DIABETES REGIMEN: NovoLog  Synthroid  150 mcg daily  Lisinopril  2.5 mg daily Rosuvastatin  20 mg daily    Statin: No ACE-I/ARB: No Prior Diabetic Education: Yes   CONTINUOUS GLUCOSE MONITORING RECORD INTERPRETATION    Dates of Recording:12/16-12/29/2025  Sensor description:dexcom  Results statistics:   CGM use % of time 43  Average and SD 259/89  Time in range 25%  % Time Above 180 76  % Time Below target 0   Glycemic patterns summary: BGs remain elevated throughout the day and night  Hyperglycemic episodes  postprandial   Hypoglycemic episodes occurred N/A  Overnight periods: trends down but mostly elevated    DIABETIC COMPLICATIONS: Microvascular complications:   Denies: CKD, retinopathy  Last eye exam: Completed 07/10/2022  Macrovascular complications:   Denies: CAD, PVD, CVA   PAST HISTORY: Past Medical History:  Past Medical History:  Diagnosis Date   ADHD (attention deficit hyperactivity disorder)    Celiac disease    Diabetes mellitus type I (HCC)    Duanes syndrome    Goiter    Hashimoto's thyroiditis    Hyperlipidemia type II    Hypoglycemia associated with diabetes (HCC)    Hypoglycemia unawareness in type 1 diabetes mellitus (HCC)    Hypothyroidism, acquired, autoimmune    Past Surgical History:  Past Surgical History:  Procedure Laterality Date   NO PAST SURGERIES      Social History:  reports that she has never smoked. She has never used smokeless tobacco. She reports current alcohol use. She reports that she does not use  drugs. Family History:  Family History  Problem Relation Age of Onset   Diabetes Mother    Thyroid  disease Mother    Celiac disease Mother    Diabetes Sister    Thyroid  disease Sister    Celiac disease Sister    Diabetes Maternal Uncle    Thyroid  disease Maternal Grandmother    Celiac disease Maternal Grandmother    Cancer Neg Hx      HOME MEDICATIONS: Allergies as of 03/31/2024   No Known Allergies      Medication List        Accurate as of March 31, 2024  9:26 AM. If you have any questions, ask your nurse or doctor.          Amphetamine  Sulfate 10 MG Tabs Commonly known as: Evekeo  Take 1-2 tablets (10-20 mg total) by mouth 2 (two) times daily.   Amphetamine  Sulfate 10 MG Tabs Take 1 tablet (10 mg total) by mouth 3 (three) times daily.   Amphetamine  Sulfate 10 MG Tabs Take 1 tablet (10 mg total) by mouth 3 (three) times daily.   Baqsimi  Two Pack 3 MG/DOSE Powd Generic drug: Glucagon  Place 1 application  into the nose as needed. Use as directed if unconscious, unable to take food by mouth, or having a seizure due to hypoglycemia   Calcium -Vitamin D-Vitamin K 500-100-40 MG-UNT-MCG Chew Chew by mouth.   cetirizine 10 MG tablet Commonly known as: ZYRTEC Take 10 mg by mouth as needed for allergies.   Dexcom G7 Sensor Misc Place a new Dexcom G7 sensor every 10 days.   escitalopram  20 MG tablet Commonly known as: LEXAPRO  Take 1 tablet (20 mg total) by mouth daily.   escitalopram  20 MG tablet Commonly known as: LEXAPRO  Take 1 tablet (20 mg total) by mouth daily.   Hailey FE 1/20 1-20 MG-MCG tablet Generic drug: norethindrone-ethinyl estradiol -FE TAKE 1 TABLET DAILY   lisinopril  2.5 MG tablet Commonly known as: ZESTRIL  Take 1 tablet (2.5 mg total) by mouth daily.   multivitamin tablet Take 1 tablet by mouth daily.   NovoLOG  100 UNIT/ML injection Generic drug: insulin  aspart Inject max 60 Units into the skin daily per pump   rosuvastatin  20 MG  tablet Commonly known as: Crestor  Take 1 tablet (20 mg total) by mouth daily.   Synthroid  150 MCG tablet Generic drug: levothyroxine  Take 1 tablet (150 mcg total) by mouth daily before breakfast.         ALLERGIES: No Known Allergies   REVIEW OF SYSTEMS: A comprehensive ROS was conducted with the patient and is negative except as per HPI  OBJECTIVE:   VITAL SIGNS: BP 120/80  Ht 5' 5 (1.651 m)   Wt 152 lb (68.9 kg)   BMI 25.29 kg/m    PHYSICAL EXAM:  General: Pt appears well and is in NAD  Thyroid : No nodules or goiter appreciated  Lungs: Clear with good BS bilat   Heart: RRR   Abdomen:  soft, nontender  Extremities:  Lower extremities - No pretibial edema  Neuro: MS is good with appropriate affect, pt is alert and Ox3    DM foot exam: 12/13/2023  The skin of the feet is intact without sores or ulcerations. The pedal pulses are 2+ on right and 2+ on left. The sensation is intact to a screening 5.07, 10 gram monofilament bilaterally   DATA REVIEWED:  Lab Results  Component Value Date   HGBA1C 10.5 (A) 03/31/2024   HGBA1C 11.4 (H) 12/12/2023   HGBA1C 10.0 (A) 10/26/2022      Latest Reference Range & Units 12/12/23 09:18  Sodium 135 - 146 mmol/L 138  Potassium 3.5 - 5.3 mmol/L 4.4  Chloride 98 - 110 mmol/L 101  CO2 20 - 32 mmol/L 28  Glucose 65 - 99 mg/dL 88  Mean Plasma Glucose mg/dL 719  BUN 7 - 25 mg/dL 9  Creatinine 9.49 - 9.03 mg/dL 9.28  Calcium  8.6 - 10.2 mg/dL 89.4 (H)  BUN/Creatinine Ratio 6 - 22 (calc) SEE NOTE:  eGFR > OR = 60 mL/min/1.37m2 122  Total CHOL/HDL Ratio <5.0 (calc) 3.3  Cholesterol <200 mg/dL 688 (H)  HDL Cholesterol > OR = 50 mg/dL 93  LDL Cholesterol (Calc) mg/dL (calc) 797 (H)  MICROALB/CREAT RATIO <30 mg/g creat 109 (H)  Non-HDL Cholesterol (Calc) <130 mg/dL (calc) 781 (H)  Triglycerides <150 mg/dL 62     Latest Reference Range & Units 12/12/23 09:18  eAG (mmol/L) mmol/L 15.5  Glucose 65 - 99 mg/dL 88  Hemoglobin  J8R <4.2 % 11.4 (H)  TSH mIU/L 4.64 (H)  T4,Free(Direct) 0.8 - 1.8 ng/dL 1.1    Latest Reference Range & Units 12/12/23 09:18  Microalb, Ur mg/dL 1.2  MICROALB/CREAT RATIO <30 mg/g creat 109 (H)  Creatinine, Urine 20 - 275 mg/dL 11 (L)     Latest Reference Range & Units 07/25/22 12:03  Total CHOL/HDL Ratio  3  Cholesterol 0 - 200 mg/dL 827  HDL Cholesterol >60.99 mg/dL 37.89  LDL (calc) 0 - 99 mg/dL 90  NonHDL  890.41  Triglycerides 0.0 - 149.0 mg/dL 899.9  VLDL 0.0 - 59.9 mg/dL 79.9    Latest Reference Range & Units 07/25/22 12:03  TSH 0.35 - 5.50 uIU/mL 2.32     ASSESSMENT / PLAN / RECOMMENDATIONS:   1) Type 1 Diabetes Mellitus, Poorly controlled, With microalbuminuria complications - Most recent A1c of 10.5 %. Goal A1c < 7.0 %.    -A1c has trended down from 11.4% to 10.5%, she has done better with bolusing for meals, I did encourage the patient to continue with carbohydrate entry, she is on a standing dose of carbohydrates as has not been successful in counting carbohydrates.  I will increase grams of carbohydrates from 4 with meals to 6 g with meals -She has been noted with lapses  of CGM use, I did explain to the patient the importance of having CGM use so the pump can remain in IQ control which provides better glucose control - Hypoglycemia overnight has resolved, no other changes    MEDICATIONS: NovoLog       Pump   tandem Settings  SF  Insulin  type   novolog   Basal rate        0000  0.720 u/h  45   0600 1.250 60   0800 1.400 60   1200 1.350 65   2200 1.400 45            I:C ratio        0000 1:1 Enter #6g with each meal                  Goal        0000  120        EDUCATION / INSTRUCTIONS: BG monitoring instructions: Patient is instructed to check her blood sugars 3 times a day, before each meal. Call [facility] if: BG persistently < 70  I reviewed the Rule of 15 for the treatment of hypoglycemia in detail with the patient.  Literature supplied.   2) Diabetic complications:  Eye: Does not have known diabetic retinopathy.  Neuro/ Feet: Does not have known diabetic peripheral neuropathy. Renal: Patient does not have known baseline CKD. She is not on an ACEI/ARB at present.  3) Hashimoto's thyroiditis:  - Patient is clinically euthyroid - TFTs today*****   Medication Synthroid  150 mcg daily   4)Dyslipidemia:  -Patient with imperfect adherence to rosuvastatin , LDL increased from 90 MGs/DL to 797 MGs/DL - I did increase rosuvastatin  in September, 2025  Medication rosuvastatin  20 mg daily  5) Microalbuminuria:   - She has been noted with elevated MA/CR ratio in September, 2025 -She was started on lisinopril , tolerating well -I did advise the patient that she should discontinue lisinopril  when she is off COC's due to to iatrogenic effects of lisinopril .  Patient expressed understanding  Medication Continue lisinopril  2.5 mg daily  Follow-up in 4 months  Signed electronically by: Stefano Redgie Butts, MD  [facility]  [facility] [address] [place] [place], [place] [address] Phone: [phone] FAX: [phone]   CC: Patient, No Pcp Per No address on file Phone: None  Fax: None    Return to Endocrinology clinic as below: Future Appointments  Date Time Provider Department Center  03/31/2024  9:30 AM Bennette Hasty, Donell Redgie, MD [id number] None      "

## 2024-03-31 NOTE — Patient Instructions (Signed)
 Enter #6 grams of carbohydrates before each meal  Enter # 3 grams with a snack      - HOW TO TREAT LOW BLOOD SUGARS (Blood sugar LESS THAN 70 MG/DL) Please follow the RULE OF 15 for the treatment of hypoglycemia treatment (when your (blood sugars are less than 70 mg/dL)   STEP 1: Take 15 grams of carbohydrates when your blood sugar is low, which includes:  3-4 GLUCOSE TABS  OR 3-4 OZ OF JUICE OR REGULAR SODA OR ONE TUBE OF GLUCOSE GEL    STEP 2: RECHECK blood sugar in 15 MINUTES STEP 3: If your blood sugar is still low at the 15 minute recheck --> then, go back to STEP 1 and treat AGAIN with another 15 grams of carbohydrates.

## 2024-04-01 ENCOUNTER — Ambulatory Visit: Payer: Self-pay | Admitting: Internal Medicine

## 2024-04-01 ENCOUNTER — Other Ambulatory Visit (HOSPITAL_COMMUNITY): Payer: Self-pay

## 2024-04-01 LAB — TSH: TSH: 0.82 m[IU]/L

## 2024-04-01 LAB — T4, FREE: Free T4: 1.6 ng/dL (ref 0.8–1.8)

## 2024-04-01 MED ORDER — SYNTHROID 150 MCG PO TABS
150.0000 ug | ORAL_TABLET | Freq: Every day | ORAL | 3 refills | Status: DC
  Filled 2024-04-01: qty 90, 90d supply, fill #0

## 2024-04-02 ENCOUNTER — Other Ambulatory Visit (HOSPITAL_COMMUNITY): Payer: Self-pay

## 2024-04-02 MED ORDER — AMPHETAMINE SULFATE 10 MG PO TABS
10.0000 mg | ORAL_TABLET | Freq: Three times a day (TID) | ORAL | 0 refills | Status: AC
  Filled 2024-04-02 – 2024-05-01 (×4): qty 270, 90d supply, fill #0

## 2024-04-04 ENCOUNTER — Other Ambulatory Visit (HOSPITAL_COMMUNITY): Payer: Self-pay

## 2024-04-19 ENCOUNTER — Other Ambulatory Visit (HOSPITAL_COMMUNITY): Payer: Self-pay

## 2024-05-01 ENCOUNTER — Other Ambulatory Visit (HOSPITAL_COMMUNITY): Payer: Self-pay

## 2024-05-02 ENCOUNTER — Other Ambulatory Visit: Payer: Self-pay | Admitting: Internal Medicine

## 2024-05-02 ENCOUNTER — Other Ambulatory Visit: Payer: Self-pay

## 2024-05-02 ENCOUNTER — Other Ambulatory Visit (HOSPITAL_COMMUNITY): Payer: Self-pay

## 2024-05-02 DIAGNOSIS — E1065 Type 1 diabetes mellitus with hyperglycemia: Secondary | ICD-10-CM

## 2024-05-02 MED ORDER — DEXCOM G7 SENSOR MISC
3 refills | Status: AC
  Filled 2024-05-02: qty 9, fill #0

## 2024-05-03 ENCOUNTER — Other Ambulatory Visit (HOSPITAL_COMMUNITY): Payer: Self-pay

## 2024-07-28 ENCOUNTER — Ambulatory Visit: Admitting: Internal Medicine
